# Patient Record
Sex: Male | Born: 1970 | Hispanic: No | Marital: Married | Smoking: Never smoker
Health system: Southern US, Community
[De-identification: ages and names within clinical notes are randomized; demographics above are authoritative.]

## PROBLEM LIST (undated history)

## (undated) DIAGNOSIS — E119 Type 2 diabetes mellitus without complications: Secondary | ICD-10-CM

## (undated) DIAGNOSIS — R000 Tachycardia, unspecified: Secondary | ICD-10-CM

## (undated) DIAGNOSIS — R011 Cardiac murmur, unspecified: Principal | ICD-10-CM

## (undated) DIAGNOSIS — I10 Essential (primary) hypertension: Secondary | ICD-10-CM

## (undated) HISTORY — DX: Cardiac murmur, unspecified: R01.1

## (undated) HISTORY — DX: Tachycardia, unspecified: R00.0

## (undated) HISTORY — DX: Essential (primary) hypertension: I10

## (undated) HISTORY — DX: Type 2 diabetes mellitus without complications: E11.9

---

## 2015-09-01 ENCOUNTER — Encounter (HOSPITAL_COMMUNITY): Payer: Self-pay | Admitting: Emergency Medicine

## 2015-09-01 ENCOUNTER — Emergency Department (HOSPITAL_COMMUNITY)
Admission: EM | Admit: 2015-09-01 | Discharge: 2015-09-01 | Disposition: A | Discharge status: Home / Self Care | Payer: Managed Care, Other (non HMO) | Attending: Emergency Medicine | Admitting: Emergency Medicine

## 2015-09-01 ENCOUNTER — Emergency Department (HOSPITAL_COMMUNITY): Payer: Managed Care, Other (non HMO)

## 2015-09-01 DIAGNOSIS — R200 Anesthesia of skin: Secondary | ICD-10-CM | POA: Diagnosis present

## 2015-09-01 DIAGNOSIS — F419 Anxiety disorder, unspecified: Secondary | ICD-10-CM | POA: Insufficient documentation

## 2015-09-01 DIAGNOSIS — E119 Type 2 diabetes mellitus without complications: Secondary | ICD-10-CM | POA: Diagnosis not present

## 2015-09-01 DIAGNOSIS — E860 Dehydration: Secondary | ICD-10-CM | POA: Diagnosis not present

## 2015-09-01 DIAGNOSIS — R000 Tachycardia, unspecified: Secondary | ICD-10-CM | POA: Diagnosis not present

## 2015-09-01 DIAGNOSIS — Z79899 Other long term (current) drug therapy: Secondary | ICD-10-CM | POA: Diagnosis not present

## 2015-09-01 HISTORY — DX: Type 2 diabetes mellitus without complications: E11.9

## 2015-09-01 LAB — I-STAT TROPONIN, ED: TROPONIN I, POC: 0 ng/mL (ref 0.00–0.08)

## 2015-09-01 LAB — BASIC METABOLIC PANEL
ANION GAP: 10 (ref 5–15)
BUN: 9 mg/dL (ref 6–20)
CHLORIDE: 102 mmol/L (ref 101–111)
CO2: 26 mmol/L (ref 22–32)
Calcium: 9.7 mg/dL (ref 8.9–10.3)
Creatinine, Ser: 0.78 mg/dL (ref 0.61–1.24)
GFR calc non Af Amer: >60 mL/min (ref >60)
GLUCOSE: 198 mg/dL — AB (ref 65–99)
POTASSIUM: 3.8 mmol/L (ref 3.5–5.1)
Sodium: 138 mmol/L (ref 135–145)

## 2015-09-01 LAB — I-STAT VENOUS BLOOD GAS, ED
Acid-base deficit: 1 mmol/L (ref 0.0–2.0)
Bicarbonate: 25.5 mEq/L — ABNORMAL HIGH (ref 20.0–24.0)
O2 SAT: 88 %
PCO2 VEN: 46.7 mmHg (ref 45.0–50.0)
PH VEN: 7.345 — AB (ref 7.250–7.300)
PO2 VEN: 58 mmHg — AB (ref 30.0–45.0)
TCO2: 27 mmol/L (ref 0–100)

## 2015-09-01 LAB — CBG MONITORING, ED
Glucose-Capillary: 191 mg/dL — ABNORMAL HIGH (ref 65–99)
Glucose-Capillary: 120 mg/dL — ABNORMAL HIGH (ref 65–99)

## 2015-09-01 LAB — CBC
HEMATOCRIT: 41.9 % (ref 39.0–52.0)
HEMOGLOBIN: 14.4 g/dL (ref 13.0–17.0)
MCH: 29 pg (ref 26.0–34.0)
MCHC: 34.4 g/dL (ref 30.0–36.0)
MCV: 84.5 fL (ref 78.0–100.0)
Platelets: 313 10*3/uL (ref 150–400)
RBC: 4.96 MIL/uL (ref 4.22–5.81)
RDW: 12.7 % (ref 11.5–15.5)
WBC: 7.7 10*3/uL (ref 4.0–10.5)

## 2015-09-01 LAB — OSMOLALITY: Osmolality: 294 mOsm/kg (ref 275–295)

## 2015-09-01 MED ORDER — SODIUM CHLORIDE 0.9 % IV BOLUS (SEPSIS)
1000.0000 mL | Freq: Once | INTRAVENOUS | Status: AC
  Administered 2015-09-01: 1000 mL via INTRAVENOUS

## 2015-09-01 MED ORDER — LORAZEPAM 1 MG PO TABS
1.0000 mg | ORAL_TABLET | Freq: Once | ORAL | Status: AC
  Administered 2015-09-01: 1 mg via ORAL
  Filled 2015-09-01: qty 1

## 2015-09-01 NOTE — ED Notes (Signed)
Patient transported to X-ray 

## 2015-09-01 NOTE — ED Notes (Signed)
Per pt, was seen his PCP today for numbness in his L pointer finger that started yesterday. Pt states he is diabetic. The numbness has resolved, however ekg done at PCP shows atrial flutter and pt denies hx of this. Pt is AAOX4, in NAD denies CP or SOB.

## 2015-09-01 NOTE — Discharge Instructions (Signed)
Follow-up with your primary care physician on Monday

## 2015-09-01 NOTE — ED Provider Notes (Signed)
CSN: [id number]     Arrival date & time 09/01/15  1706 History   First MD Initiated Contact with Patient 09/01/15 1726     Chief Complaint  Patient presents with  . Numbness  . Atrial Fibrillation     (Consider location/radiation/quality/duration/timing/severity/associated sxs/prior Treatment) HPI  Patient is a 44 year old male with past medical history significant for diabetes, who presents to the emergency department with left index finger numbness and tachycardia. Patient reports that last night he noted a gradual onset of numbness to the distal portion of his left index finger. Denies trauma or pain to his finger. Numbness does not travel up his arm. Numbness improved when he elevates his left hand. His prior history of numbness or injury in the past. Patient was evaluated at his PCP earlier today. EKG at his PCP showed a flutter, which was new for the patient so he was sent to the emergency department for evaluation. Denies heart palpitations, chest pain, shortness of breath, nausea, vomiting, diaphoresis. Reports he has increase urination and decreased by mouth intake over the past week due to being busy at work. Has missed multiple doses of his diabetic medications and has not checked his glucose. Denies prior history of heart arrhythmias.  Past Medical History  Diagnosis Date  . Diabetes mellitus without complication (HCC)    History reviewed. No pertinent past surgical history. No family history on file. Social History  Substance Use Topics  . Smoking status: Never Smoker   . Smokeless tobacco: None  . Alcohol Use: No    Review of Systems  Constitutional: Negative for fever, diaphoresis and appetite change.  HENT: Negative for congestion.   Eyes: Negative for visual disturbance.  Respiratory: Negative for chest tightness and shortness of breath.   Cardiovascular: Negative for chest pain and palpitations.  Gastrointestinal: Negative for nausea, vomiting, abdominal pain and  blood in stool.  Genitourinary: Negative for dysuria, hematuria, flank pain and decreased urine volume.  Musculoskeletal: Negative for back pain.  Skin: Negative for rash.  Neurological: Positive for numbness. Negative for dizziness, seizures, weakness, light-headedness and headaches.  Psychiatric/Behavioral: Negative for behavioral problems. The patient is nervous/anxious.       Allergies  Review of patient's allergies indicates no known allergies.  Home Medications   Prior to Admission medications   Medication Sig Start Date End Date Taking? Authorizing Provider  losartan-hydrochlorothiazide (HYZAAR) 100-12.5 MG tablet Take 1 tablet by mouth daily.   Yes Historical Provider, MD  SitaGLIPtin-MetFORMIN HCl (JANUMET XR) 50-1000 MG TB24 Take 2 tablets by mouth daily.   Yes Historical Provider, MD   BP 99/62 mmHg  Pulse 86  Temp(Src) 97.8 F (36.6 C) (Oral)  Resp 15  SpO2 100% Physical Exam  Constitutional: He is oriented to person, place, and time. He appears well-developed and well-nourished. No distress.  HENT:  Head: Normocephalic and atraumatic.  Mouth/Throat: Oropharynx is clear and moist.  Eyes: Conjunctivae and EOM are normal. Pupils are equal, round, and reactive to light.  Neck: Normal range of motion. Neck supple. No JVD present. No tracheal deviation present.  Cardiovascular: Regular rhythm, normal heart sounds and intact distal pulses.   No murmur heard. Tachycardic   Pulmonary/Chest: Effort normal and breath sounds normal. No respiratory distress. He has no wheezes. He exhibits no tenderness.  Abdominal: Soft. There is no tenderness.  Musculoskeletal: Normal range of motion.       Left hand: He exhibits normal range of motion, no tenderness, no bony tenderness, normal capillary refill, no deformity  and no swelling. Normal sensation noted. Normal strength noted.  L distal index finger with sensation intact  Neurological: He is alert and oriented to person, place,  and time. He has normal strength and normal reflexes. No cranial nerve deficit or sensory deficit. He displays a negative Romberg sign. GCS eye subscore is 4. GCS verbal subscore is 5. GCS motor subscore is 6.  Skin: Skin is warm.  Psychiatric: He has a normal mood and affect.  Nursing note and vitals reviewed.   ED Course  Procedures (including critical care time) Labs Review Labs Reviewed  BASIC METABOLIC PANEL - Abnormal; Notable for the following:    Glucose, Bld 198 (*)    All other components within normal limits  CBG MONITORING, ED - Abnormal; Notable for the following:    Glucose-Capillary 191 (*)    All other components within normal limits  I-STAT VENOUS BLOOD GAS, ED - Abnormal; Notable for the following:    pH, Ven 7.345 (*)    pO2, Ven 58.0 (*)    Bicarbonate 25.5 (*)    All other components within normal limits  CBG MONITORING, ED - Abnormal; Notable for the following:    Glucose-Capillary 120 (*)    All other components within normal limits  CBC  OSMOLALITY  I-STAT TROPOININ, ED    Imaging Review Dg Chest 2 View  09/01/2015  CLINICAL DATA:  Tachycardia. Abnormal EKG. Diabetes and hypertension. EXAM: CHEST  2 VIEW COMPARISON:  None. FINDINGS: The heart size and mediastinal contours are within normal limits. Both lungs are clear. The visualized skeletal structures are unremarkable. IMPRESSION: No active cardiopulmonary disease. Electronically Signed   By: Myles RosenthalJohn  Stahl M.D.   On: 09/01/2015 17:56   I have personally reviewed and evaluated these images and lab results as part of my medical decision-making.   EKG Interpretation   Date/Time:  Friday September 01 2015 17:20:54 EST Ventricular Rate:  117 PR Interval:  138 QRS Duration: 82 QT Interval:  310 QTC Calculation: 432 R Axis:   80 Text Interpretation:  Sinus tachycardia Otherwise normal ECG Confirmed by  LITTLE MD, RACHEL [phone]) on 09/01/2015 7:40:53 PM      MDM   Final diagnoses:  Numbness   Tachycardia  Dehydration    Patient is a 44 year old male with past medical history significant for diabetes, who presents to the emergency department with tachycardia and a left index finger numbness. On arrival no acute distress, not ill appearing. Afebrile, tachycardic in the 120s, normotensive. Exam as above, notable for dry mucous membranes, lungs clear to auscultation, RRR, intact distal pulses, benign abdominal exam.  Glucose 191. Labwork unremarkable without any signs of DKA or hyperosmolar hyperglycemic syndrome. Hyperglycemia secondary to medication non-compliance, doubt elevation secondary to infectious process. No electrolyte abnormalities. EKG showed sinus tachycardia, rate 117, normal intervals, no chamber enlargement, no signs of ischemia. No prior EKG to compare. Chest x-ray showed no acute findings. Patient most likely with tachycardia secondary to volume depletion in the setting of decreased PO intake and polydipsia. No chest pain, palpitations, or shortness of breath.  Low risk Well's criteria. HEART score 2. Doubt pulmonary embolism, ACS, infectious process.  Serial troponin negative. Given 1L normal saline 2.   On reevaluation patient's tachycardia improved, but continued to be tachycardic in the low 100s patient reports he has had a significant amount of stress and anxiety, is currently anxious about work. States she has not had a day off of work in the past month, and states that  he worries about work constantly. Patient appears anxious. Given 1 mg Ativan by mouth. On reevaluation patient's tachycardia resolved, rate in the 80s. Patient asymptomatic with stable vital signs.   Patient stable for discharge home. Discussed follow up with PCP on Monday for re-evaluation. Given strict return precautions to the emergency department.     Corena Herter, MD 09/02/15 1914  Laurence Spates, MD 09/06/15 [phone]

## 2015-09-05 ENCOUNTER — Telehealth: Payer: Self-pay | Admitting: Cardiovascular Disease

## 2015-09-05 NOTE — Telephone Encounter (Signed)
Received records from [facility] for appointment on 09/28/15 with Dr Rifton.  Records given to N Hines (medical records) for Dr Hazel Green's schedule on 09/28/15. lp °

## 2015-09-06 ENCOUNTER — Emergency Department (HOSPITAL_COMMUNITY): Payer: Managed Care, Other (non HMO)

## 2015-09-06 ENCOUNTER — Emergency Department (HOSPITAL_COMMUNITY)
Admission: EM | Admit: 2015-09-06 | Discharge: 2015-09-06 | Disposition: A | Discharge status: Home / Self Care | Payer: Managed Care, Other (non HMO) | Attending: Emergency Medicine | Admitting: Emergency Medicine

## 2015-09-06 ENCOUNTER — Encounter (HOSPITAL_COMMUNITY): Payer: Self-pay | Admitting: Emergency Medicine

## 2015-09-06 DIAGNOSIS — R5383 Other fatigue: Secondary | ICD-10-CM | POA: Insufficient documentation

## 2015-09-06 DIAGNOSIS — R000 Tachycardia, unspecified: Secondary | ICD-10-CM | POA: Insufficient documentation

## 2015-09-06 DIAGNOSIS — Z7984 Long term (current) use of oral hypoglycemic drugs: Secondary | ICD-10-CM | POA: Diagnosis not present

## 2015-09-06 DIAGNOSIS — R531 Weakness: Secondary | ICD-10-CM | POA: Diagnosis not present

## 2015-09-06 DIAGNOSIS — R630 Anorexia: Secondary | ICD-10-CM | POA: Insufficient documentation

## 2015-09-06 DIAGNOSIS — F419 Anxiety disorder, unspecified: Secondary | ICD-10-CM | POA: Insufficient documentation

## 2015-09-06 DIAGNOSIS — E1165 Type 2 diabetes mellitus with hyperglycemia: Secondary | ICD-10-CM | POA: Diagnosis not present

## 2015-09-06 DIAGNOSIS — R739 Hyperglycemia, unspecified: Secondary | ICD-10-CM

## 2015-09-06 DIAGNOSIS — R202 Paresthesia of skin: Secondary | ICD-10-CM

## 2015-09-06 DIAGNOSIS — Z79899 Other long term (current) drug therapy: Secondary | ICD-10-CM | POA: Insufficient documentation

## 2015-09-06 DIAGNOSIS — R200 Anesthesia of skin: Secondary | ICD-10-CM | POA: Diagnosis present

## 2015-09-06 DIAGNOSIS — R079 Chest pain, unspecified: Secondary | ICD-10-CM | POA: Diagnosis not present

## 2015-09-06 DIAGNOSIS — Z8679 Personal history of other diseases of the circulatory system: Secondary | ICD-10-CM | POA: Insufficient documentation

## 2015-09-06 LAB — CBC
HEMATOCRIT: 40.3 % (ref 39.0–52.0)
HEMOGLOBIN: 13.7 g/dL (ref 13.0–17.0)
MCH: 28.7 pg (ref 26.0–34.0)
MCHC: 34 g/dL (ref 30.0–36.0)
MCV: 84.5 fL (ref 78.0–100.0)
Platelets: 260 10*3/uL (ref 150–400)
RBC: 4.77 MIL/uL (ref 4.22–5.81)
RDW: 12.4 % (ref 11.5–15.5)
WBC: 7 10*3/uL (ref 4.0–10.5)

## 2015-09-06 LAB — BASIC METABOLIC PANEL
ANION GAP: 11 (ref 5–15)
BUN: 12 mg/dL (ref 6–20)
CO2: 23 mmol/L (ref 22–32)
Calcium: 9.2 mg/dL (ref 8.9–10.3)
Chloride: 101 mmol/L (ref 101–111)
Creatinine, Ser: 0.9 mg/dL (ref 0.61–1.24)
GFR calc Af Amer: >60 mL/min (ref >60)
GLUCOSE: 252 mg/dL — AB (ref 65–99)
POTASSIUM: 3.8 mmol/L (ref 3.5–5.1)
SODIUM: 135 mmol/L (ref 135–145)

## 2015-09-06 LAB — I-STAT TROPONIN, ED: Troponin i, poc: 0 ng/mL (ref 0.00–0.08)

## 2015-09-06 MED ORDER — SODIUM CHLORIDE 0.9 % IV BOLUS (SEPSIS)
1000.0000 mL | Freq: Once | INTRAVENOUS | Status: AC
  Administered 2015-09-06: 1000 mL via INTRAVENOUS

## 2015-09-06 NOTE — ED Notes (Signed)
Pt comfortable with discharge and follow up instructions. Pt declines wheelchair, escorted to waiting area by this RN. No prescriptions. 

## 2015-09-06 NOTE — ED Provider Notes (Signed)
CSN: [id number]     Arrival date & time 09/06/15  0900 History   First MD Initiated Contact with Patient 09/06/15 [phone]     Chief Complaint  Patient presents with  . Numbness      The history is provided by the patient.   patient presents with a couple different complaints. States he's had some numbness in his left hand. Starts on his left index finger on the PIP joint and works its way out. States it feels as if someone put a rubber band around it. He states a few days ago he had some cold exposure and all the fingers were numb but this one just a numb. He's been seen in the ER for it. He got seen by primary care doctor diagnosed with atrial flutter, which is reportedly new onset. States he now has occasional chest pain. States it will be a sharp tingle and will last a second. It'll come and go. States he also has some numbness and heaviness in his left forearm. States it is from the elbow down. States that things feel heavier than they are otherwise. That will also come and go. No headache. No confusion. No weight loss. Also has some anxiety. He's been eating and drinking less due to work.  Past Medical History  Diagnosis Date  . Diabetes mellitus without complication (HCC)    History reviewed. No pertinent past surgical history. No family history on file. Social History  Substance Use Topics  . Smoking status: Never Smoker   . Smokeless tobacco: None  . Alcohol Use: No    Review of Systems  Constitutional: Positive for fatigue. Negative for activity change and appetite change.  Eyes: Negative for pain.  Respiratory: Negative for chest tightness and shortness of breath.   Cardiovascular: Positive for chest pain. Negative for leg swelling.  Gastrointestinal: Negative for nausea, vomiting, abdominal pain and diarrhea.  Genitourinary: Negative for flank pain.  Musculoskeletal: Negative for back pain and neck stiffness.  Skin: Negative for rash.  Neurological: Positive for weakness  and numbness. Negative for headaches.  Psychiatric/Behavioral: Negative for behavioral problems. The patient is nervous/anxious.       Allergies  Review of patient's allergies indicates no known allergies.  Home Medications   Prior to Admission medications   Medication Sig Start Date End Date Taking? Authorizing Provider  losartan-hydrochlorothiazide (HYZAAR) 100-12.5 MG tablet Take 1 tablet by mouth daily.   Yes Historical Provider, MD  SitaGLIPtin-MetFORMIN HCl (JANUMET XR) 50-1000 MG TB24 Take 1 tablet by mouth 2 (two) times daily.    Yes Historical Provider, MD   BP 111/87 mmHg  Pulse 110  Temp(Src) 98.1 F (36.7 C) (Oral)  Resp 16  Ht  (1.676 m)  Wt 155 lb (70.308 kg)  BMI 25.03 kg/m2  SpO2 99% Physical Exam  Constitutional: He is oriented to person, place, and time. He appears well-developed and well-nourished.  HENT:  Head: Normocephalic and atraumatic.  Eyes: EOM are normal. Pupils are equal, round, and reactive to light.  Neck: Normal range of motion. Neck supple.  Cardiovascular: Regular rhythm and normal heart sounds.   No murmur heard. Mild tachycardia  Pulmonary/Chest: Effort normal and breath sounds normal.  Abdominal: Soft. Bowel sounds are normal. He exhibits no distension and no mass. There is no tenderness. There is no rebound and no guarding.  Musculoskeletal: Normal range of motion. He exhibits no edema.  Neurological: He is alert and oriented to person, place, and time. No cranial nerve deficit.  Sensation grossly intact in both hands. Strong radial pulse bilaterally. Good grips bilaterally. Good flexion-extension elbows and shoulders.  Skin: Skin is warm and dry.  Psychiatric: He has a normal mood and affect.  Nursing note and vitals reviewed.   ED Course  Procedures (including critical care time) Labs Review Labs Reviewed  BASIC METABOLIC PANEL - Abnormal; Notable for the following:    Glucose, Bld 252 (*)    All other components within  normal limits  CBC  I-STAT TROPOININ, ED    Imaging Review Mr Brain Wo Contrast  09/06/2015  CLINICAL DATA:  Five day history of numbness in the left hand. New onset of weakness in the left shoulder and left upper extremity in general. Intermittent numbness in the right hand. EXAM: MRI HEAD WITHOUT CONTRAST TECHNIQUE: Multiplanar, multiecho pulse sequences of the brain and surrounding structures were obtained without intravenous contrast. COMPARISON:  None. FINDINGS: The brain has a normal appearance on all pulse sequences without evidence of malformation, atrophy, old or acute infarction, mass lesion, hemorrhage, hydrocephalus or extra-axial collection. No pituitary mass. No fluid in the sinuses, middle ears or mastoids. No skull or skullbase lesion. There is flow in the major vessels at the base of the brain. Major venous sinuses show flow. IMPRESSION: Normal examination. No cause of the presenting symptoms is identified. Electronically Signed   By: Paulina FusiMark  Shogry M.D.   On: 09/06/2015 12:02   I have personally reviewed and evaluated these images and lab results as part of my medical decision-making.   EKG Interpretation   Date/Time:  Wednesday September 06 2015 09:25:18 EST Ventricular Rate:  131 PR Interval:  139 QRS Duration: 85 QT Interval:  302 QTC Calculation: 446 R Axis:   69 Text Interpretation:  Sinus tachycardia Borderline T wave abnormalities  Confirmed by Rubin PayorPICKERING  MD, Lyrick Lagrand [phone]([id number]) on 09/06/2015 10:21:00 AM      MDM   Final diagnoses:  Tachycardia  Paresthesias  Hyperglycemia     patient tachycardia. Sinus tachycardia. EKG at the patient brought from the doctor's office previously that was interpreted as atrial flutter does not appear to show atrial flutter. I do not know if this was what they made the determination off of or if there was another EKG showed it. Without the history of atrial flutter is a us risk for ischemic event. MRI done since the arm seemed heavier  but this is likely more carpal tunnel. Also mild hyperglycemia patient is diabetic. Will discharge home to follow-up with cardiology as needed and with PCP.    Benjiman CoreNathan Curtez Brallier, MD 09/07/15 [phone]

## 2015-09-06 NOTE — Discharge Instructions (Signed)
Hyperglycemia °Hyperglycemia occurs when the glucose (sugar) in your blood is too high. Hyperglycemia can happen for many reasons, but it most often happens to people who do not know they have diabetes or are not managing their diabetes properly.  °CAUSES  °Whether you have diabetes or not, there are other causes of hyperglycemia. Hyperglycemia can occur when you have diabetes, but it can also occur in other situations that you might not be as aware of, such as: °Diabetes °· If you have diabetes and are having problems controlling your blood glucose, hyperglycemia could occur because of some of the following reasons: °· Not following your meal plan. °· Not taking your diabetes medications or not taking it properly. °· Exercising less or doing less activity than you normally do. °· Being sick. °Pre-diabetes °· This cannot be ignored. Before people develop Type 2 diabetes, they almost always have "pre-diabetes." This is when your blood glucose levels are higher than normal, but not yet high enough to be diagnosed as diabetes. Research has shown that some long-term damage to the body, especially the heart and circulatory system, may already be occurring during pre-diabetes. If you take action to manage your blood glucose when you have pre-diabetes, you may delay or prevent Type 2 diabetes from developing. °Stress °· If you have diabetes, you may be "diet" controlled or on oral medications or insulin to control your diabetes. However, you may find that your blood glucose is higher than usual in the hospital whether you have diabetes or not. This is often referred to as "stress hyperglycemia." Stress can elevate your blood glucose. This happens because of hormones put out by the body during times of stress. If stress has been the cause of your high blood glucose, it can be followed regularly by your caregiver. That way he/she can make sure your hyperglycemia does not continue to get worse or progress to  diabetes. °Steroids °· Steroids are medications that act on the infection fighting system (immune system) to block inflammation or infection. One side effect can be a rise in blood glucose. Most people can produce enough extra insulin to allow for this rise, but for those who cannot, steroids make blood glucose levels go even higher. It is not unusual for steroid treatments to "uncover" diabetes that is developing. It is not always possible to determine if the hyperglycemia will go away after the steroids are stopped. A special blood test called an A1c is sometimes done to determine if your blood glucose was elevated before the steroids were started. °SYMPTOMS °· Thirsty. °· Frequent urination. °· Dry mouth. °· Blurred vision. °· Tired or fatigue. °· Weakness. °· Sleepy. °· Tingling in feet or leg. °DIAGNOSIS  °Diagnosis is made by monitoring blood glucose in one or all of the following ways: °· A1c test. This is a chemical found in your blood. °· Fingerstick blood glucose monitoring. °· Laboratory results. °TREATMENT  °First, knowing the cause of the hyperglycemia is important before the hyperglycemia can be treated. Treatment may include, but is not be limited to: °· Education. °· Change or adjustment in medications. °· Change or adjustment in meal plan. °· Treatment for an illness, infection, etc. °· More frequent blood glucose monitoring. °· Change in exercise plan. °· Decreasing or stopping steroids. °· Lifestyle changes. °HOME CARE INSTRUCTIONS  °· Test your blood glucose as directed. °· Exercise regularly. Your caregiver will give you instructions about exercise. Pre-diabetes or diabetes which comes on with stress is helped by exercising. °· Eat wholesome,   balanced meals. Eat often and at regular, fixed times. Your caregiver or nutritionist will give you a meal plan to guide your sugar intake.  Being at an ideal weight is important. If needed, losing as little as 10 to 15 pounds may help improve blood  glucose levels. SEEK MEDICAL CARE IF:   You have questions about medicine, activity, or diet.  You continue to have symptoms (problems such as increased thirst, urination, or weight gain). SEEK IMMEDIATE MEDICAL CARE IF:   You are vomiting or have diarrhea.  Your breath smells fruity.  You are breathing faster or slower.  You are very sleepy or incoherent.  You have numbness, tingling, or pain in your feet or hands.  You have chest pain.  Your symptoms get worse even though you have been following your caregiver's orders.  If you have any other questions or concerns.   This information is not intended to replace advice given to you by your health care provider. Make sure you discuss any questions you have with your health care provider.   Document Released: 03/05/2001 Document Revised: 12/02/2011 Document Reviewed: 05/16/2015 Elsevier Interactive Patient Education 2016 [employer] Inc[employer]  Nonspecific Tachycardia Tachycardia is a faster than normal heartbeat (more than 100 beats per minute). In adults, the heart normally beats between 60 and 100 times a minute. A fast heartbeat may be a normal response to exercise or stress. It does not necessarily mean that something is wrong. However, sometimes when your heart beats too fast it may not be able to pump enough blood to the rest of your body. This can result in chest pain, shortness of breath, dizziness, and even fainting. Nonspecific tachycardia means that the specific cause or pattern of your tachycardia is unknown. CAUSES  Tachycardia may be harmless or it may be due to a more serious underlying cause. Possible causes of tachycardia include:  Exercise or exertion.  Fever.  Pain or injury.  Infection.  Loss of body fluids (dehydration).  Overactive thyroid.  Lack of red blood cells (anemia).  Anxiety and stress.  Alcohol.  Caffeine.  Tobacco products.  Diet pills.  Illegal drugs.  Heart  disease. SYMPTOMS  Rapid or irregular heartbeat (palpitations).  Suddenly feeling your heart beating (cardiac awareness).  Dizziness.  Tiredness (fatigue).  Shortness of breath.  Chest pain.  Nausea.  Fainting. DIAGNOSIS  Your caregiver will perform a physical exam and take your medical history. In some cases, a heart specialist (cardiologist) may be consulted. Your caregiver may also order:  Blood tests.  Electrocardiography. This test records the electrical activity of your heart.  A heart monitoring test. TREATMENT  Treatment will depend on the likely cause of your tachycardia. The goal is to treat the underlying cause of your tachycardia. Treatment methods may include:  Replacement of fluids or blood through an intravenous (IV) tube for moderate to severe dehydration or anemia.  New medicines or changes in your current medicines.  Diet and lifestyle changes.  Treatment for certain infections.  Stress relief or relaxation methods. HOME CARE INSTRUCTIONS   Rest.  Drink enough fluids to keep your urine clear or pale yellow.  Do not smoke.  Avoid:  Caffeine.  Tobacco.  Alcohol.  Chocolate.  Stimulants such as over-the-counter diet pills or pills that help you stay awake.  Situations that cause anxiety or stress.  Illegal drugs such as marijuana, phencyclidine (PCP), and cocaine.  Only take medicine as directed by your caregiver.  Keep all follow-up appointments as directed by your  caregiver. SEEK IMMEDIATE MEDICAL CARE IF:   You have pain in your chest, upper arms, jaw, or neck.  You become weak, dizzy, or feel faint.  You have palpitations that will not go away.  You vomit, have diarrhea, or pass blood in your stool.  Your skin is cool, pale, and wet.  You have a fever that will not go away with rest, fluids, and medicine. MAKE SURE YOU:   Understand these instructions.  Will watch your condition.  Will get help right away if you  are not doing well or get worse.   This information is not intended to replace advice given to you by your health care provider. Make sure you discuss any questions you have with your health care provider.   Document Released: 10/17/2004 Document Revised: 12/02/2011 Document Reviewed: 03/24/2015 Elsevier Interactive Patient Education 2016 Else[employer] Inc. Carpal Tunnel Syndrome Carpal tunnel syndrome is a condition that causes pain in your hand and arm. The carpal tunnel is a narrow area located on the palm side of your wrist. Repeated wrist motion or certain diseases may cause swelling within the tunnel. This swelling pinches the main nerve in the wrist (median nerve). CAUSES  This condition may be caused by:   Repeated wrist motions.  Wrist injuries.  Arthritis.  A cyst or tumor in the carpal tunnel.  Fluid buildup during pregnancy. Sometimes the cause of this condition is not known.  RISK FACTORS This condition is more likely to develop in:   People who have jobs that cause them to repeatedly move their wrists in the same motion, such as butchers and cashiers.  Women.  People with certain conditions, such as:  Diabetes.  Obesity.  An underactive thyroid (hypothyroidism).  Kidney failure. SYMPTOMS  Symptoms of this condition include:   A tingling feeling in your fingers, especially in your thumb, index, and middle fingers.  Tingling or numbness in your hand.  An aching feeling in your entire arm, especially when your wrist and elbow are bent for long periods of time.  Wrist pain that goes up your arm to your shoulder.  Pain that goes down into your palm or fingers.  A weak feeling in your hands. You may have trouble grabbing and holding items. Your symptoms may feel worse during the night.  DIAGNOSIS  This condition is diagnosed with a medical history and physical exam. You may also have tests, including:   An electromyogram (EMG). This test measures electrical  signals sent by your nerves into the muscles.  X-rays. TREATMENT  Treatment for this condition includes:  Lifestyle changes. It is important to stop doing or modify the activity that caused your condition.  Physical or occupational therapy.  Medicines for pain and inflammation. This may include medicine that is injected into your wrist.  A wrist splint.  Surgery. HOME CARE INSTRUCTIONS  If You Have a Splint:  Wear it as told by your health care provider. Remove it only as told by your health care provider.  Loosen the splint if your fingers become numb and tingle, or if they turn cold and blue.  Keep the splint clean and dry. General Instructions  Take over-the-counter and prescription medicines only as told by your health care provider.  Rest your wrist from any activity that may be causing your pain. If your condition is work related, talk to your employer about changes that can be made, such as getting a wrist pad to use while typing.  If directed, apply ice to  the painful area:  Put ice in a plastic bag.  Place a towel between your skin and the bag.  Leave the ice on for 20 minutes, 2-3 times per day.  Keep all follow-up visits as told by your health care provider. This is important.  Do any exercises as told by your health care provider, physical therapist, or occupational therapist. SEEK MEDICAL CARE IF:   You have new symptoms.  Your pain is not controlled with medicines.  Your symptoms get worse.   This information is not intended to replace advice given to you by your health care provider. Make sure you discuss any questions you have with your health care provider.   Document Released: 09/06/2000 Document Revised: 05/31/2015 Document Reviewed: 01/25/2015 Elsevier Interactive Patient Education [employer]

## 2015-09-06 NOTE — ED Notes (Signed)
Seen in ED 5 days ago for numbness left fingers and tachycardia.  Discharged and seen Primary Doctor yesterday.  Patient states last night chest pain for couple of seconds with pain and heaviness left shoulder and left upper extremity. At a different time developed numbness right hand and resolved with in seconds. States continue to have numbness since last week left index finger. Currently wearing a velco splint left wrist given by Doctor one day ago.

## 2015-09-27 ENCOUNTER — Other Ambulatory Visit: Payer: Self-pay | Admitting: *Deleted

## 2015-09-27 ENCOUNTER — Encounter: Payer: Self-pay | Admitting: Endocrinology

## 2015-09-27 ENCOUNTER — Ambulatory Visit (INDEPENDENT_AMBULATORY_CARE_PROVIDER_SITE_OTHER): Payer: Managed Care, Other (non HMO) | Admitting: Endocrinology

## 2015-09-27 VITALS — BP 114/70 | HR 133 | Temp 98.1°F | Resp 14 | Ht 66.0 in | Wt 154.8 lb

## 2015-09-27 DIAGNOSIS — I10 Essential (primary) hypertension: Secondary | ICD-10-CM | POA: Diagnosis not present

## 2015-09-27 DIAGNOSIS — R000 Tachycardia, unspecified: Secondary | ICD-10-CM

## 2015-09-27 DIAGNOSIS — E1165 Type 2 diabetes mellitus with hyperglycemia: Secondary | ICD-10-CM | POA: Diagnosis not present

## 2015-09-27 LAB — TSH: TSH: 1.34 u[IU]/mL (ref 0.35–4.50)

## 2015-09-27 LAB — T4, FREE: FREE T4: 1.19 ng/dL (ref 0.60–1.60)

## 2015-09-27 MED ORDER — GLUCOSE BLOOD VI STRP: ORAL_STRIP | Status: DC

## 2015-09-27 NOTE — Patient Instructions (Signed)
Check blood sugars on waking up  2-3 times a week Also check blood sugars about 2 hours after a meal and do this after different meals by rotation once a day  Recommended blood sugar levels on waking up is 90-130 and about 2 hours after meal is 130-160  Please bring your blood sugar monitor to each visit, thank you  Start adding protein to each meal in the form of lentils, beans, tofu, yogurt.  Reduce portions of starchy foods like rice, potatoes and  bread, use brown rice and whole-grain bread  Start walking 20-40 minutes at least 3-4 times a week

## 2015-09-27 NOTE — Progress Notes (Signed)
Patient ID: Roberto Roberson, male   DOB: [birth year], 45 y.o.   MRN: [id number]           Reason for Appointment: Consultation for Type 2 Diabetes  Referring physician: None  History of Present Illness:          Date of diagnosis of type 2 diabetes mellitus:  2006      Background history:   At diagnosis he had a glucose of 300 and do symptomatic with increased thirst and urination He was given unknown oral hypoglycemic drugs in [place] before he came here  Recent history:   He has recently moved from [place] and he has been treated with Janumet XR since late 2015, previously on glipizide and metformin His A1c was 6.4 in 7/16  Current blood sugar patterns and problems identified:    his A1c is slightly higher than 6.4 in July  He will sometimes have higher readings in the morning but he thinks this is more if eating late is checking his blood sugar for couple of hours after waking up  Periodically has readings over 180 after meals but recently has not checked many  His diet is relatively higher in carbohydrate with very little protein  Does not exercise  Checks blood sugar sporadically at least recently  Non-insulin hypoglycemic drugs the patient is taking are: Janumet 50/1000 twice a day      Side effects from medications have been:  Compliance with the medical regimen: Fair  Hypoglycemia:    Glucose monitoring:  done  times a day         Glucometer:  Accu-Chek Blood Glucose readings by time of day and averages from meter download, has only 7 readings for the last 4 weeks:  PREMEAL Breakfast Lunch Dinner Bedtime  Overall   Glucose range: 115-166  109   121     Median:      143    POST-MEAL PC Breakfast PC Lunch PC Dinner  Glucose range:   199    Median:      Self-care: The diet that the patient has been following is: tries to limit sugar and fats .     Meal times are:  Breakfast is at Lunch: Dinner:   Typical meal intake: Breakfast is rice or wheat based  bread, some lentils, similar meals at lunch and other with some vegetables.  Has fruit for snacks                Dietician visit, most recent: Never               Exercise:  none  Weight: has gained about 3-4 pounds since 7/16  Wt Readings from Last 3 Encounters:  09/27/15 154 lb 12.8 oz (70.217 kg)  09/06/15 155 lb (70.308 kg)    Glycemic control:    A1c outside lab = 7.0  Microalbumin/creatinine ratio 1.1 in 7/16 Last LDL was 100   No results found for: HGBA1C Lab Results  Component Value Date   CREATININE 0.90 09/06/2015        Medication List       This list is accurate as of: 09/27/15  9:20 AM.  Always use your most recent med list.               JANUMET XR 50-1000 MG Tb24  Generic drug:  SitaGLIPtin-MetFORMIN HCl  Take 1 tablet by mouth 2 (two) times daily.     losartan-hydrochlorothiazide 100-12.5 MG tablet  Commonly known as:  HYZAAR  Take 1 tablet by mouth daily.        Allergies: No Known Allergies  Past Medical History  Diagnosis Date  . Diabetes mellitus without complication ([place])     No past surgical history on file.  Family History  Problem Relation Age of Onset  . Diabetes Mother   . Diabetes Father   . Hypertension Father   . Heart disease Neg Hx   . Kidney disease Neg Hx     Social History:  reports that he has never smoked. He does not have any smokeless tobacco history on file. He reports that he does not drink alcohol. His drug history is not on file.    Review of Systems    Lipid history: At one point was taking Lipitor but discussed was stopped.  Last LDL 120 16 with HDL 44 and normal triglycerides   No results found for: CHOL, HDL, LDLCALC, LDLDIRECT, TRIG, CHOLHDL         Hypertension: On treatment for about a year  Most recent eye exam was in 12/16  Most recent foot exam: 1/17  Review of Systems  Constitutional: Negative for weight loss.  HENT: Negative for headaches.   Respiratory: Negative for shortness of  breath.   Cardiovascular: Negative for chest pain and palpitations.  Gastrointestinal: Negative for constipation and diarrhea.       Heartburn  Endocrine: Negative for fatigue, polydipsia and light-headedness.       No episodes of flushing  Musculoskeletal: Negative for joint pain.  Skin: Negative for rash.  Neurological: Negative for tingling and tremors.       In the left second finger he feels a stiffness without obvious numbness or tingling for the last month or so  Psychiatric/Behavioral: Negative for nervousness.      LABS:  No visits with results within 1 Week(s) from this visit. Latest known visit with results is:  Admission on 09/06/2015, Discharged on 09/06/2015  Component Date Value Ref Range Status  . Sodium 09/06/2015 135  135 - 145 mmol/L Final  . Potassium 09/06/2015 3.8  3.5 - 5.1 mmol/L Final  . Chloride 09/06/2015 101  101 - 111 mmol/L Final  . CO2 09/06/2015 23  22 - 32 mmol/L Final  . Glucose, Bld 09/06/2015 252* 65 - 99 mg/dL Final  . BUN 09/06/2015 12  6 - 20 mg/dL Final  . Creatinine, Ser 09/06/2015 0.90  0.61 - 1.24 mg/dL Final  . Calcium 09/06/2015 9.2  8.9 - 10.3 mg/dL Final  . GFR calc non Af Amer 09/06/2015 >60  >60 mL/min Final  . GFR calc Af Amer 09/06/2015 >60  >60 mL/min Final   Comment: (NOTE) The eGFR has been calculated using the CKD EPI equation. This calculation has not been validated in all clinical situations. eGFR's persistently <60 mL/min signify possible Chronic Kidney Disease.   . Anion gap 09/06/2015 11  5 - 15 Final  . WBC 09/06/2015 7.0  4.0 - 10.5 K/uL Final  . RBC 09/06/2015 4.77  4.22 - 5.81 MIL/uL Final  . Hemoglobin 09/06/2015 13.7  13.0 - 17.0 g/dL Final  . HCT 09/06/2015 40.3  39.0 - 52.0 % Final  . MCV 09/06/2015 84.5  78.0 - 100.0 fL Final  . MCH 09/06/2015 28.7  26.0 - 34.0 pg Final  . MCHC 09/06/2015 34.0  30.0 - 36.0 g/dL Final  . RDW 09/06/2015 12.4  11.5 - 15.5 % Final  . Platelets 09/06/2015 260  150 - 400  K/uL  Final  . Troponin i, poc 09/06/2015 0.00  0.00 - 0.08 ng/mL Final  . Comment 3 09/06/2015          Final   Comment: Due to the release kinetics of cTnI, a negative result within the first hours of the onset of symptoms does not rule out myocardial infarction with certainty. If myocardial infarction is still suspected, repeat the test at appropriate intervals.     Physical Examination:  BP 114/70 mmHg  Pulse 133  Temp(Src) 98.1 F (36.7 C)  Resp 14  Ht _0  (1.676 m)  Wt 154 lb 12.8 oz (70.217 kg)  BMI 25.00 kg/m2  SpO2 98%  GENERAL:      averagely built and nourished      HEENT:         Eye exam shows normal external appearance. Fundus exam shows no retinopathy.  Oral exam shows normal mucosa.  NECK:   There is no lymphadenopathy Thyroid is not enlarged and no nodules felt.  Carotids are normal to palpation and no bruit heard LUNGS:         Chest is symmetrical. Lungs are clear to auscultation.Marland Kitchen   HEART:       heart rate 112 regular    Heart sounds:  S1 and S2 are normal. No murmur or click heard., no S3 or S4.   ABDOMEN:   There is no distention present. Liver and spleen are not palpable. No other mass or tenderness present.   NEUROLOGICAL:    No tremor Vibration sense is minimally reduced in distal first toes. Ankle and biceps jerks are brisk bilaterally, relatively difficult to elicit .          Diabetic foot exam shows normal monofilament sensation in the toes and plantar surfaces, no skin lesions or ulcers on the feet and normal pedal pulses MUSCULOSKELETAL:  There is no swelling or deformity of the peripheral joints. Spine is normal to inspection.   EXTREMITIES:     There is no edema. No skin lesions present.Marland Kitchen SKIN:       No rash or lesions of concern.        ASSESSMENT:  Diabetes type 2, nonobese with fair control and A1c 7% Previously A1c had been down to 6.4% with current regimen of Janumet XR maximum doses Appears to have mostly high postprandial blood  sugars although checking infrequently recently He can make significant improvements in his diet with reducing carbohydrate and adding protein to each meal    Currently has no exercise regimen Has minimal knowledge of diabetes  Complications: None evident  History of hypertension, well-controlled  Sinus tachycardia which appears to be a recent abnormality.  Etiology unclear and he does not clinically appear hyperthyroid but has not had any recent thyroid levels  PLAN:    Discussed improved meal planning with low glycemic index foods and adding protein to each meal, discussed sources of protein for vegetarian meal plan  Start regular walking, needs to walk about 10 miles a week  More blood sugars after meals including supper which he is not doing  Review blood sugars again in 6 weeks  Consider adding Precose if needed to each meal for better postprandial control  Will check lipids on the next visit  Check thyroid levels today  Patient Instructions  Check blood sugars on waking up  2-3 times a week Also check blood sugars about 2 hours after a meal and do this after different meals by rotation once a day  Recommended blood sugar levels on waking up is 90-130 and about 2 hours after meal is 130-160  Please bring your blood sugar monitor to each visit, thank you  Start adding protein to each meal in the form of lentils, beans, tofu, yogurt.  Reduce portions of starchy foods like rice, potatoes and  bread, use brown rice and whole-grain bread  Start walking 20-40 minutes at least 3-4 times a week     Roberto Roberson 09/27/2015, 9:20 AM   Note: This office note was prepared with Estate agent. Any transcriptional errors that result from this process are unintentional.

## 2015-09-28 ENCOUNTER — Ambulatory Visit (INDEPENDENT_AMBULATORY_CARE_PROVIDER_SITE_OTHER): Payer: Managed Care, Other (non HMO) | Admitting: Cardiovascular Disease

## 2015-09-28 ENCOUNTER — Encounter: Payer: Self-pay | Admitting: Cardiovascular Disease

## 2015-09-28 ENCOUNTER — Other Ambulatory Visit: Payer: Self-pay | Admitting: *Deleted

## 2015-09-28 ENCOUNTER — Telehealth: Payer: Self-pay | Admitting: Endocrinology

## 2015-09-28 VITALS — BP 142/88 | HR 131 | Ht 66.0 in | Wt 155.5 lb

## 2015-09-28 DIAGNOSIS — R011 Cardiac murmur, unspecified: Secondary | ICD-10-CM | POA: Diagnosis not present

## 2015-09-28 DIAGNOSIS — R000 Tachycardia, unspecified: Secondary | ICD-10-CM

## 2015-09-28 DIAGNOSIS — E119 Type 2 diabetes mellitus without complications: Secondary | ICD-10-CM | POA: Insufficient documentation

## 2015-09-28 HISTORY — DX: Cardiac murmur, unspecified: R01.1

## 2015-09-28 HISTORY — DX: Tachycardia, unspecified: R00.0

## 2015-09-28 HISTORY — DX: Type 2 diabetes mellitus without complications: E11.9

## 2015-09-28 MED ORDER — ONETOUCH VERIO IQ SYSTEM W/DEVICE KIT: PACK | Status: DC

## 2015-09-28 MED ORDER — ONETOUCH DELICA LANCETS 33G MISC: Status: DC

## 2015-09-28 MED ORDER — GLUCOSE BLOOD VI STRP: ORAL_STRIP | Status: DC

## 2015-09-28 NOTE — Progress Notes (Signed)
Cardiology Office Note   Date:  09/28/2015   ID:  Roberto Roberson, DOB 09-30-1970, MRN [id number]  PCP:  Emeterio Reeve, MD  Cardiologist:   Madilyn Hook, MD   Chief Complaint  Patient presents with  . Appointment    Denies SOB, CP and swelling      History of Present Illness: Roberto Roberson is a 45 y.o. male with hypertension and diabetes mellitus type 2 who presents for an evaluation of sinus tachycardia.  Roberto Roberson saw Dr. Mila Palmer on 09/05/15.  t that time he was complaining ofleft hand numbness. An EKG was obtained with an automatic readout of atrial flutter. He was referred to the emergency department.on further review of his EKG it was noted that he actually was in sinus tachycardia. No atrial flutter was noted. It was felt that histachycardia may be related to stress aniety He was instructed to follow-up with cardiology as an outpatient.  Thyroid function and basic metabolic panel were within normal limits when checked by his endocrinologist on January 4.  Roberto Roberson reports significant work stress. He works 50 days continuously without a break. Each shift as 12 hours straight.  He denies any chills, dysuria or cough.  A week and a half ago he had cold symptoms, but these have resolved.  E does not drink caffeine or use over-the-counter decongestants.  Roberto Roberson reports that his heart rate has been elevated for the last 10 years. This is especially prominent when he is stressed or anxious.  He states that his heart rate was 80 bpm for he came into the building and increased after getting into the doctor's office. He denies any chest pain, shortness of breath, lower extremity edema, orthopnea or PND.  He has noted some numbness in his left hand hat is exacerbated by cold weather.  He does not get any exercise due to work stress and lack of time.   Past Medical History  Diagnosis Date  . Diabetes mellitus without complication (HCC)   .  Hypertension   . Diabetes mellitus type 2 in nonobese (HCC) 09/28/2015  . Sinus tachycardia (HCC) 09/28/2015  . Murmur 09/28/2015    No past surgical history on file.   Current Outpatient Prescriptions  Medication Sig Dispense Refill  . glucose blood (ACCU-CHEK SMARTVIEW) test strip Use as instructed to check blood sugar 2-3 times per week. Dx code E11.65 25 each 2  . losartan-hydrochlorothiazide (HYZAAR) 100-12.5 MG tablet Take 1 tablet by mouth daily.    . SitaGLIPtin-MetFORMIN HCl (JANUMET XR) 50-1000 MG TB24 Take 1 tablet by mouth 2 (two) times daily.      No current facility-administered medications for this visit.    Allergies:   Review of patient's allergies indicates no known allergies.    Social History:  The patient  reports that he has never smoked. He does not have any smokeless tobacco history on file. He reports that he does not drink alcohol.   Family History:  The patient's family history includes Diabetes in his father and mother; Hypertension in his father. There is no history of Heart disease or Kidney disease.    ROS:  Please see the history of present illness.   Otherwise, review of systems are positive for none.   All other systems are reviewed and negative.    PHYSICAL EXAM: VS:  BP 142/88 mmHg  Pulse 131  Ht 5\' 6"  (1.676 m)  Wt 70.534 kg (155 lb 8 oz)  BMI 25.11 kg/m2 ,  BMI Body mass index is 25.11 kg/(m^2). GENERAL:  Well appearing HEENT:  Pupils equal round and reactive, fundi not visualized, oral mucosa unremarkable NECK:  No jugular venous distention, waveform within normal limits, carotid upstroke brisk and symmetric, no bruits, no thyromegaly LYMPHATICS:  No cervical adenopathy LUNGS:  Clear to auscultation bilaterally HEART:  RRR.  PMI not displaced or sustained,S1 and S2 within normal limits, no S3, no S4, no clicks, no rubs, II/VI holosystolic murmur at the LLSB. ABD:  Flat, positive bowel sounds normal in frequency in pitch, no bruits, no rebound,  no guarding, no midline pulsatile mass, no hepatomegaly, no splenomegaly EXT:  2 plus pulses throughout, no edema, no cyanosis no clubbing SKIN:  No rashes no nodules NEURO:  Cranial nerves II through XII grossly intact, motor grossly intact throughout PSYCH:  Cognitively intact, oriented to person place and time    EKG:  EKG is ordered today. The ekg ordered today demonstrates sinus tachycardia. Rate 131 bpm. Nonspecific T wave abnormalities. 09/01/15: Sinus tachycardia. Rate 116 bpm.  Recent Labs: 09/06/2015: BUN 12; Creatinine, Ser 0.90; Hemoglobin 13.7; Platelets 260; Potassium 3.8; Sodium 135 09/27/2015: TSH 1.34    Lipid Panel No results found for: CHOL, TRIG, HDL, CHOLHDL, VLDL, LDLCALC, LDLDIRECT    Wt Readings from Last 3 Encounters:  09/28/15 70.534 kg (155 lb 8 oz)  09/27/15 70.217 kg (154 lb 12.8 oz)  09/06/15 70.308 kg (155 lb)      ASSESSMENT AND PLAN:  # Sinus tachycardia: Roberto Roberson has asymptomatic sinus tachycardia. He only clear trigger is stress and anxiety. He does not drink caffeinated beverages or over-the-counter decongestants. There is no sign of infection or pulmonary embolism. It is unclear whether he has inappropriate sinus tachycardia or whether his heart rate is elevated in the setting of seeing physicians. We will obtain a 24-hour Holter monitor to determine his overall heart rate and heart rate patterns.  # Murmur: Roberto Roberson has a very mild murmur. We will obtain a transthoracic echo to assess for structural heart disease.  # Hypertension: Blood pressure mildly elevated and he admits to feeling anxious.  Continue losartan/HCTZ without changes for now. His blood pressure was well-controlled on 09/27/15. Current medicines are reviewed at length with the patient today.  The patient does not have concerns regarding medicines.  The following changes have been made:  no change  Labs/ tests ordered today include:   Orders Placed This Encounter    Procedures  . Holter monitor - 24 hour  . EKG 12-Lead  . ECHOCARDIOGRAM COMPLETE     Disposition:   FU with Kalem Rockwell C. Duke Salviaandolph, MD, [facility] in 3 months.    This note was written with the assistance of speech recognition software.  Please excuse any transcriptional errors.  Signed, Destenie Ingber C. Duke Salviaandolph, MD, [facility]  09/28/2015 9:29 AM    [facility]

## 2015-09-28 NOTE — Telephone Encounter (Signed)
rx sent

## 2015-09-28 NOTE — Telephone Encounter (Signed)
accucheck is no longer going to be covered by insurance the alternates are one touch ultra or verio please call in all new rx to [facility] on [phone]

## 2015-09-28 NOTE — Patient Instructions (Signed)
Medication Instructions:  Please continue your current medications  Labwork: NONE  Testing/Procedures: 1. Echocardiogram - Your physician has requested that you have an echocardiogram. Echocardiography is a painless test that uses sound waves to create images of your heart. It provides your doctor with information about the size and shape of your heart and how well your heart's chambers and valves are working. This procedure takes approximately one hour. There are no restrictions for this procedure.  2. 24-Hour Holter - Your physician has recommended that you wear a holter monitor. Holter monitors are medical devices that record the heart's electrical activity. Doctors most often use these monitors to diagnose arrhythmias. Arrhythmias are problems with the speed or rhythm of the heartbeat. The monitor is a small, portable device. You can wear one while you do your normal daily activities. This is usually used to diagnose what is causing palpitations/syncope (passing out).  Follow-Up: Dr Duke Salviaandolph recommends that you schedule a follow-up appointment in 3 months.  If you need a refill on your cardiac medications before your next appointment, please call your pharmacy.

## 2015-09-29 ENCOUNTER — Ambulatory Visit (INDEPENDENT_AMBULATORY_CARE_PROVIDER_SITE_OTHER): Payer: Managed Care, Other (non HMO)

## 2015-09-29 DIAGNOSIS — R011 Cardiac murmur, unspecified: Secondary | ICD-10-CM

## 2015-09-29 DIAGNOSIS — R000 Tachycardia, unspecified: Secondary | ICD-10-CM | POA: Diagnosis not present

## 2015-10-10 ENCOUNTER — Other Ambulatory Visit: Payer: Self-pay | Admitting: *Deleted

## 2015-10-10 ENCOUNTER — Telehealth: Payer: Self-pay | Admitting: Endocrinology

## 2015-10-10 MED ORDER — GLUCOSE BLOOD VI STRP: ORAL_STRIP | Status: DC

## 2015-10-10 NOTE — Telephone Encounter (Signed)
Patient stated One Touch Verio test strips were to expensive, [facility]'s less expensive. She didn't know the number

## 2015-10-10 NOTE — Telephone Encounter (Signed)
[facility]

## 2015-10-11 ENCOUNTER — Other Ambulatory Visit (HOSPITAL_COMMUNITY): Payer: Managed Care, Other (non HMO)

## 2015-10-13 ENCOUNTER — Telehealth: Payer: Self-pay | Admitting: Endocrinology

## 2015-10-13 ENCOUNTER — Telehealth: Payer: Self-pay | Admitting: *Deleted

## 2015-10-13 NOTE — Telephone Encounter (Signed)
[facility] on the 17th of this month.

## 2015-10-13 NOTE — Telephone Encounter (Signed)
-----   Message from Chilton Si, MD sent at 10/12/2015  6:19 PM EST ----- No dangerous or abnormal heart rhythms noted.

## 2015-10-13 NOTE — Telephone Encounter (Signed)
Wife calling for rx for [facility] regarding the glucose meter and test strips and lancets ID# [id number]

## 2015-10-13 NOTE — Telephone Encounter (Signed)
Spoke to patien'ts wife. Result given . Verbalized understanding  

## 2015-10-17 ENCOUNTER — Other Ambulatory Visit: Payer: Self-pay | Admitting: *Deleted

## 2015-10-17 MED ORDER — GLUCOSE BLOOD VI STRP: ORAL_STRIP | Status: DC

## 2015-10-17 MED ORDER — ONETOUCH VERIO IQ SYSTEM W/DEVICE KIT: PACK | Status: AC

## 2015-10-17 MED ORDER — ONETOUCH DELICA LANCETS 33G MISC: Status: DC

## 2015-10-19 ENCOUNTER — Other Ambulatory Visit: Payer: Self-pay

## 2015-10-19 ENCOUNTER — Ambulatory Visit (HOSPITAL_COMMUNITY): Payer: Managed Care, Other (non HMO) | Attending: Cardiovascular Disease

## 2015-10-19 DIAGNOSIS — R000 Tachycardia, unspecified: Secondary | ICD-10-CM | POA: Insufficient documentation

## 2015-10-19 DIAGNOSIS — R011 Cardiac murmur, unspecified: Secondary | ICD-10-CM | POA: Diagnosis not present

## 2015-10-19 DIAGNOSIS — I10 Essential (primary) hypertension: Secondary | ICD-10-CM | POA: Diagnosis not present

## 2015-10-19 DIAGNOSIS — E119 Type 2 diabetes mellitus without complications: Secondary | ICD-10-CM | POA: Insufficient documentation

## 2015-10-24 ENCOUNTER — Telehealth: Payer: Self-pay | Admitting: *Deleted

## 2015-10-24 NOTE — Telephone Encounter (Signed)
-----   Message from Chilton Si, MD sent at 10/23/2015  4:33 PM EST ----- Normal echo.

## 2015-10-24 NOTE — Telephone Encounter (Signed)
Spoke to patient'S wife. Result given . Verbalized understanding  3 month Recall appointment schedule for 01/19/16 at 4 pm

## 2015-11-07 ENCOUNTER — Other Ambulatory Visit: Payer: Managed Care, Other (non HMO)

## 2015-11-08 ENCOUNTER — Ambulatory Visit: Payer: Managed Care, Other (non HMO) | Admitting: Endocrinology

## 2015-11-10 ENCOUNTER — Ambulatory Visit: Payer: Managed Care, Other (non HMO) | Admitting: Endocrinology

## 2015-11-27 ENCOUNTER — Other Ambulatory Visit (INDEPENDENT_AMBULATORY_CARE_PROVIDER_SITE_OTHER): Payer: Managed Care, Other (non HMO)

## 2015-11-27 DIAGNOSIS — E1165 Type 2 diabetes mellitus with hyperglycemia: Secondary | ICD-10-CM

## 2015-11-27 LAB — BASIC METABOLIC PANEL
BUN: 19 mg/dL (ref 6–23)
CALCIUM: 9.4 mg/dL (ref 8.4–10.5)
CHLORIDE: 103 meq/L (ref 96–112)
CO2: 26 meq/L (ref 19–32)
CREATININE: 0.85 mg/dL (ref 0.40–1.50)
GFR: 104.21 mL/min (ref >60.00)
Glucose, Bld: 144 mg/dL — ABNORMAL HIGH (ref 70–99)
Potassium: 4.5 mEq/L (ref 3.5–5.1)
Sodium: 136 mEq/L (ref 135–145)

## 2015-11-27 LAB — LIPID PANEL
Cholesterol: 170 mg/dL (ref 0–200)
HDL: 38.5 mg/dL — AB (ref >39.00)
LDL Cholesterol: 110 mg/dL — ABNORMAL HIGH (ref 0–99)
NONHDL: 131.57
TRIGLYCERIDES: 106 mg/dL (ref 0.0–149.0)
Total CHOL/HDL Ratio: 4
VLDL: 21.2 mg/dL (ref 0.0–40.0)

## 2015-11-28 LAB — FRUCTOSAMINE: FRUCTOSAMINE: 255 umol/L (ref 0–285)

## 2015-11-30 ENCOUNTER — Ambulatory Visit: Payer: Managed Care, Other (non HMO) | Admitting: Endocrinology

## 2015-12-22 ENCOUNTER — Ambulatory Visit (INDEPENDENT_AMBULATORY_CARE_PROVIDER_SITE_OTHER): Payer: Managed Care, Other (non HMO) | Admitting: Endocrinology

## 2015-12-22 ENCOUNTER — Encounter: Payer: Self-pay | Admitting: Endocrinology

## 2015-12-22 VITALS — BP 120/80 | HR 100 | Temp 98.2°F | Resp 14 | Ht 66.0 in | Wt 151.4 lb

## 2015-12-22 DIAGNOSIS — E1165 Type 2 diabetes mellitus with hyperglycemia: Secondary | ICD-10-CM

## 2015-12-22 MED ORDER — ACARBOSE 25 MG PO TABS: 25.0000 mg | ORAL_TABLET | Freq: Three times a day (TID) | ORAL | Status: AC

## 2015-12-22 NOTE — Patient Instructions (Signed)
Acarbose 25mg  just before am meal  If sugar >180 after lunch and dinner may add at those meals  Janumet 2 tabs 1x daily  Check blood sugars on waking up  2-3 times a week Also check blood sugars about 2 hours after a meal and do this after different meals by rotation  Recommended blood sugar levels on waking up is 90-130 and about 2 hours after meal is 130-160  Please bring your blood sugar monitor to each visit, thank you

## 2015-12-22 NOTE — Progress Notes (Signed)
Patient ID: Roberto Roberson, male   DOB: Jun 13, 1971, 45 y.o.   MRN: [id number]           Reason for Appointment: Follow-up for Type 2 Diabetes   History of Present Illness:          Date of diagnosis of type 2 diabetes mellitus:  2006      Background history:   At diagnosis he had a glucose of 300 and do symptomatic with increased thirst and urination He was given unknown oral hypoglycemic drugs in [place] before he came here  Recent history:   He has been treated with Janumet XR since late 2015, previously on glipizide and metformin His A1c was 6.4 in 7/16 but was 7% on his initial visit in 1/17  Because of his high carbohydrate diet without protein he was advised to change her diet significantly on his last visit Also advised to check blood sugars consistently especially after meals Advised him to start regular walking which she was not doing  Current blood sugar patterns and problems identified:  He is not coming back 2 months after his initial visit  He has done only a few blood sugars and they all appear to be high  Fasting glucose was also high in the lab  Fructosamine was not significantly high at 255 however  He has however started walking a little and has lost 4 pounds  Non-insulin hypoglycemic drugs the patient is taking are: Janumet 50/1000 twice a day      Side effects from medications have been:  Compliance with the medical regimen: Fair   Glucose monitoring:  done 0.1 times a day         Glucometer: One Touch      Blood Glucose readings by time of day and averages from meter download, has only 7 readings for the last 4 weeks: RANGE 154-195 at higher readings in the afternoon  Self-care: The diet that the patient has been following is: tries to limit sugar and fats .     Meal times are:  Breakfast is at Lunch: Dinner:   Typical meal intake: Breakfast is rice or wheat based bread, some lentils, similar meals at lunch and other with some vegetables.  Has  fruit for snacks                Dietician visit, most recent: Never               Exercise:  he thinks he is walking 10,000 steps a day  Weight: has gained about 3-4 pounds since 7/16   Wt Readings from Last 3 Encounters:  12/22/15 151 lb 6.4 oz (68.675 kg)  09/28/15 155 lb 8 oz (70.534 kg)  09/27/15 154 lb 12.8 oz (70.217 kg)    Glycemic control:    A1c outside lab = 7.0  Microalbumin/creatinine ratio 1.1 in 7/16 Last LDL was 100   No results found for: HGBA1C Lab Results  Component Value Date   LDLCALC 110* 11/27/2015   CREATININE 0.85 11/27/2015   No visits with results within 1 Week(s) from this visit. Latest known visit with results is:  Lab on 11/27/2015  Component Date Value Ref Range Status  . Fructosamine 11/27/2015 255  0 - 285 umol/L Final   Comment: Published reference interval for apparently healthy subjects between age 18 and 65 is 51 - 285 umol/L and in a poorly controlled diabetic population is 228 - 563 umol/L with a mean of 396 umol/L.   Marland Kitchen  Cholesterol 11/27/2015 170  0 - 200 mg/dL Final   ATP III Classification       Desirable:  < 200 mg/dL               Borderline High:  200 - 239 mg/dL          High:  > = 240 mg/dL  . Triglycerides 11/27/2015 106.0  0.0 - 149.0 mg/dL Final   Normal:  <150 mg/dLBorderline High:  150 - 199 mg/dL  . HDL 11/27/2015 38.50* >39.00 mg/dL Final  . VLDL 11/27/2015 21.2  0.0 - 40.0 mg/dL Final  . LDL Cholesterol 11/27/2015 110* 0 - 99 mg/dL Final  . Total CHOL/HDL Ratio 11/27/2015 4   Final                  Men          Women1/2 Average Risk     3.4          3.3Average Risk          5.0          4.42X Average Risk          9.6          7.13X Average Risk          15.0          11.0                      . NonHDL 11/27/2015 131.57   Final   NOTE:  Non-HDL goal should be 30 mg/dL higher than patient's LDL goal (i.e. LDL goal of < 70 mg/dL, would have non-HDL goal of < 100 mg/dL)  . Sodium 11/27/2015 136  135 - 145 mEq/L Final    . Potassium 11/27/2015 4.5  3.5 - 5.1 mEq/L Final  . Chloride 11/27/2015 103  96 - 112 mEq/L Final  . CO2 11/27/2015 26  19 - 32 mEq/L Final  . Glucose, Bld 11/27/2015 144* 70 - 99 mg/dL Final  . BUN 11/27/2015 19  6 - 23 mg/dL Final  . Creatinine, Ser 11/27/2015 0.85  0.40 - 1.50 mg/dL Final  . Calcium 11/27/2015 9.4  8.4 - 10.5 mg/dL Final  . GFR 11/27/2015 104.21  >60.00 mL/min Final        Medication List       This list is accurate as of: 12/22/15 11:59 PM.  Always use your most recent med list.               acarbose 25 MG tablet  Commonly known as:  PRECOSE  Take 1 tablet (25 mg total) by mouth 3 (three) times daily with meals.     glucose blood test strip  Commonly known as:  ONETOUCH VERIO  Use as instructed to check blood sugar 2-3 times per week dx code E11.65     JANUMET XR 50-1000 MG Tb24  Generic drug:  SitaGLIPtin-MetFORMIN HCl  Take 1 tablet by mouth 2 (two) times daily.     losartan-hydrochlorothiazide 100-12.5 MG tablet  Commonly known as:  HYZAAR  Take 1 tablet by mouth daily.     ONETOUCH DELICA LANCETS 40J Misc  Use to check blood sugar 2-3 times per week Dx code E11.65     ONETOUCH VERIO IQ SYSTEM w/Device Kit  Use to check blood sugar 2-3 times per week Dx code E11.65        Allergies: No Known Allergies  Past Medical History  Diagnosis Date  . Diabetes mellitus without complication ([place])   . Hypertension   . Diabetes mellitus type 2 in nonobese ([place]) 09/28/2015  . Sinus tachycardia ([place]) 09/28/2015  . Murmur 09/28/2015    No past surgical history on file.  Family History  Problem Relation Age of Onset  . Diabetes Mother   . Diabetes Father   . Hypertension Father   . Heart disease Neg Hx   . Kidney disease Neg Hx     Social History:  reports that he has never smoked. He does not have any smokeless tobacco history on file. He reports that he does not drink alcohol. His drug history is not on file.    Review of Systems     Lipid history:  Not on any medications    Lab Results  Component Value Date   CHOL 170 11/27/2015   HDL 38.50* 11/27/2015   LDLCALC 110* 11/27/2015   TRIG 106.0 11/27/2015   CHOLHDL 4 11/27/2015           Hypertension: On treatment for about a year with losartan HCT  Most recent eye exam was in 12/16  Most recent foot exam: 1/17  Review of Systems     Physical Examination:  BP 120/80 mmHg  Pulse 100  Temp(Src) 98.2 F (36.8 C)  Resp 14  Ht 5' 6"  (1.676 m)  Wt 151 lb 6.4 oz (68.675 kg)  BMI 24.45 kg/m2  SpO2 97%  ASSESSMENT:  Diabetes type 2, nonobese with fair control and last A1c 7% Still has high postprandial readings with current regimen of Janumet XR maximum doses However his fructosamine is not significantly high He does need to modify his diet significantly and he has not made any changes suggested on the previous visit with cutting back on carbohydrates like rice and adding protein to each meal especially breakfast and lunch No readings after supper  PLAN:    Discussed improved meal planning with low glycemic index foods and adding protein to each meal, discussed sources of protein for vegetarian meal plan  Start Precose 25 mg at the start of breakfast daily.  If blood sugars are over 180 after lunch with modifying diet he may need to add Precose at lunch also at least  Check more readings after meals especially supper  May take Janumet together once a day for convenience  Will need to discuss lipid management on the next visit, may need repeat levels after improving diet  Patient Instructions  Acarbose 71m just before am meal  If sugar >180 after lunch and dinner may add at those meals  Janumet 2 tabs 1x daily  Check blood sugars on waking up  2-3 times a week Also check blood sugars about 2 hours after a meal and do this after different meals by rotation  Recommended blood sugar levels on waking up is 90-130 and about 2 hours after meal  is 130-160  Please bring your blood sugar monitor to each visit, thank you     [facility]4/10/2015, 9:21 PM   Note: This office note was prepared with Dragon voice recognition system technology. Any transcriptional errors that result from this process are unintentional.

## 2016-01-19 ENCOUNTER — Ambulatory Visit: Payer: Managed Care, Other (non HMO) | Admitting: Cardiovascular Disease

## 2016-05-16 ENCOUNTER — Other Ambulatory Visit: Payer: Managed Care, Other (non HMO)

## 2016-05-21 ENCOUNTER — Ambulatory Visit: Payer: Managed Care, Other (non HMO) | Admitting: Endocrinology

## 2016-06-11 ENCOUNTER — Other Ambulatory Visit (INDEPENDENT_AMBULATORY_CARE_PROVIDER_SITE_OTHER): Payer: Managed Care, Other (non HMO)

## 2016-06-11 DIAGNOSIS — E1165 Type 2 diabetes mellitus with hyperglycemia: Secondary | ICD-10-CM

## 2016-06-11 LAB — LIPID PANEL
Cholesterol: 178 mg/dL (ref 0–200)
HDL: 40.6 mg/dL (ref >39.00)
LDL Cholesterol: 105 mg/dL — ABNORMAL HIGH (ref 0–99)
NONHDL: 137.73
Total CHOL/HDL Ratio: 4
Triglycerides: 163 mg/dL — ABNORMAL HIGH (ref 0.0–149.0)
VLDL: 32.6 mg/dL (ref 0.0–40.0)

## 2016-06-11 LAB — HEMOGLOBIN A1C: Hgb A1c MFr Bld: 6.9 % — ABNORMAL HIGH (ref 4.6–6.5)

## 2016-06-11 LAB — BASIC METABOLIC PANEL
BUN: 10 mg/dL (ref 6–23)
CALCIUM: 9.3 mg/dL (ref 8.4–10.5)
CO2: 28 mEq/L (ref 19–32)
Chloride: 104 mEq/L (ref 96–112)
Creatinine, Ser: 0.8 mg/dL (ref 0.40–1.50)
GFR: 111.49 mL/min (ref >60.00)
GLUCOSE: 139 mg/dL — AB (ref 70–99)
Potassium: 4.1 mEq/L (ref 3.5–5.1)
SODIUM: 137 meq/L (ref 135–145)

## 2016-06-14 ENCOUNTER — Ambulatory Visit: Payer: Managed Care, Other (non HMO) | Admitting: Endocrinology

## 2016-06-17 ENCOUNTER — Other Ambulatory Visit: Payer: Self-pay | Admitting: *Deleted

## 2016-06-17 ENCOUNTER — Ambulatory Visit (INDEPENDENT_AMBULATORY_CARE_PROVIDER_SITE_OTHER): Payer: Managed Care, Other (non HMO) | Admitting: Endocrinology

## 2016-06-17 ENCOUNTER — Encounter: Payer: Self-pay | Admitting: Endocrinology

## 2016-06-17 VITALS — BP 124/78 | HR 111 | Temp 97.9°F | Resp 14 | Ht 66.0 in | Wt 155.8 lb

## 2016-06-17 DIAGNOSIS — I10 Essential (primary) hypertension: Secondary | ICD-10-CM

## 2016-06-17 DIAGNOSIS — E782 Mixed hyperlipidemia: Secondary | ICD-10-CM | POA: Diagnosis not present

## 2016-06-17 DIAGNOSIS — E1165 Type 2 diabetes mellitus with hyperglycemia: Secondary | ICD-10-CM

## 2016-06-17 MED ORDER — GLUCOSE BLOOD VI STRP: ORAL_STRIP | 2 refills | Status: AC

## 2016-06-17 MED ORDER — ONETOUCH DELICA LANCETS 33G MISC: 2 refills | Status: AC

## 2016-06-17 NOTE — Patient Instructions (Signed)
Acarbose 1/2 at Bfst and 1/2 at dinner  Avoid coconut products  Check blood sugars on waking up  2x per week  Also check blood sugars about 2 hours after a meal and do this after different meals by rotation  Recommended blood sugar levels on waking up is 90-130 and about 2 hours after meal is 130-160  Please bring your blood sugar monitor to each visit, thank you

## 2016-06-17 NOTE — Progress Notes (Signed)
Patient ID: Roberto Roberson, male   DOB: [birth year], 45 y.o.   MRN: [id number]           Reason for Appointment: Follow-up for Type 2 Diabetes   History of Present Illness:          Date of diagnosis of type 2 diabetes mellitus:  2006      Background history:   At diagnosis he had a glucose of 300 and do symptomatic with increased thirst and urination He was given unknown oral hypoglycemic drugs in [place] before he came here He has been treated with Janumet XR since late 2015, previously on glipizide and metformin   Recent history:   His A1c was 6.4 in 7/16 but was 7% on his initial visit in 1/17 and now 6.9  He has not been seen in follow-up for several months  Current blood sugar patterns and problems identified:  He is not checking his blood sugars in the last couple of weeks because he lost his meter  He was started on PRECOSE on his last visit at least at breakfast and he was told to consider doing this at lunch also  He has taken this only at breakfast as he thinks his afternoon readings are fairly good  However he is complaining of some bloating and gaseousness after taking the medication in the morning  He thinks he was not following his diet for a couple months when he was in [place] and has gained a little weight  However he thinks his fasting readings are fairly good  Probably not watching his diet consistently when he was in [place] with eating more coconut based and cheese products  Non-insulin hypoglycemic drugs the patient is taking are: Janumet 50/1000 twice a day      Side effects from medications have been:  Compliance with the medical regimen: Fair   Glucose monitoring:  done 0.1 times a day         Glucometer: One Touch      Blood Glucose readings by time of day by recall  Mean values apply above for all meters except median for One Touch  PRE-MEAL Fasting Lunch Dinner Bedtime Overall  Glucose range: 88-92 130+ 130-140  150   Mean/median:           Self-care: The diet that the patient has been following is: tries to limit sugar and fats .         Typical meal intake: Breakfast is rice or wheat based bread, some lentils, similar meals at lunch and other with some vegetables.  Has fruit for snacks                Dietician visit, most recent: Never               Exercise:  he thinks he is walking in the evenings  Weight:   Wt Readings from Last 3 Encounters:  06/17/16 155 lb 12.8 oz (70.7 kg)  12/22/15 151 lb 6.4 oz (68.7 kg)  09/28/15 155 lb 8 oz (70.5 kg)    Glycemic control:   Microalbumin/creatinine ratio 1.1 in 7/16 From PCP previously LDL was 100    Lab Results  Component Value Date   HGBA1C 6.9 (H) 06/11/2016   Lab Results  Component Value Date   LDLCALC 105 (H) 06/11/2016   CREATININE 0.80 06/11/2016   Lab on 06/11/2016  Component Date Value Ref Range Status  . Hgb A1c MFr Bld 06/11/2016 6.9* 4.6 - 6.5 % Final  .  Sodium 06/11/2016 137  135 - 145 mEq/L Final  . Potassium 06/11/2016 4.1  3.5 - 5.1 mEq/L Final  . Chloride 06/11/2016 104  96 - 112 mEq/L Final  . CO2 06/11/2016 28  19 - 32 mEq/L Final  . Glucose, Bld 06/11/2016 139* 70 - 99 mg/dL Final  . BUN 06/11/2016 10  6 - 23 mg/dL Final  . Creatinine, Ser 06/11/2016 0.80  0.40 - 1.50 mg/dL Final  . Calcium 06/11/2016 9.3  8.4 - 10.5 mg/dL Final  . GFR 06/11/2016 111.49  >60.00 mL/min Final  . Cholesterol 06/11/2016 178  0 - 200 mg/dL Final  . Triglycerides 06/11/2016 163.0* 0.0 - 149.0 mg/dL Final  . HDL 06/11/2016 40.60  >39.00 mg/dL Final  . VLDL 06/11/2016 32.6  0.0 - 40.0 mg/dL Final  . LDL Cholesterol 06/11/2016 105* 0 - 99 mg/dL Final  . Total CHOL/HDL Ratio 06/11/2016 4   Final  . NonHDL 06/11/2016 137.73   Final        Medication List       Accurate as of 06/17/16 12:40 PM. Always use your most recent med list.          acarbose 25 MG tablet Commonly known as:  PRECOSE Take 1 tablet (25 mg total) by mouth 3 (three) times daily  with meals.   glucose blood test strip Commonly known as:  ONETOUCH VERIO Use as instructed to check blood sugar 1 time per day dx code E11.65   JANUMET XR 50-1000 MG Tb24 Generic drug:  SitaGLIPtin-MetFORMIN HCl Take 1 tablet by mouth 2 (two) times daily.   losartan-hydrochlorothiazide 100-12.5 MG tablet Commonly known as:  HYZAAR Take 1 tablet by mouth daily.   ONETOUCH DELICA LANCETS 54U Misc Use to check blood sugar 1 times per day. Dx code E11.65   [facility] VERIO IQ SYSTEM w/Device Kit Use to check blood sugar 2-3 times per week Dx code E11.65       Allergies: No Known Allergies  Past Medical History:  Diagnosis Date  . Diabetes mellitus type 2 in nonobese ([place]) 09/28/2015  . Diabetes mellitus without complication ([place])   . Hypertension   . Murmur 09/28/2015  . Sinus tachycardia ([place]) 09/28/2015    No past surgical history on file.  Family History  Problem Relation Age of Onset  . Diabetes Mother   . Diabetes Father   . Hypertension Father   . Heart disease Neg Hx   . Kidney disease Neg Hx     Social History:  reports that he has never smoked. He does not have any smokeless tobacco history on file. He reports that he does not drink alcohol. His drug history is not on file.    Review of Systems    Lipid history: Not on any medications, Prior LDL was 100 More recently has not been consistent with diet, probably more saturated fat with coconut based cooking    Lab Results  Component Value Date   CHOL 178 06/11/2016   HDL 40.60 06/11/2016   LDLCALC 105 (H) 06/11/2016   TRIG 163.0 (H) 06/11/2016   CHOLHDL 4 06/11/2016           Hypertension: On treatment for about a year with losartan HCT, Followed by PCP  Most recent eye exam was in 12/16  Most recent foot exam: 1/17  Review of Systems     Physical Examination:  BP 124/78   Pulse (!) 111   Temp 97.9 F (36.6 C)   Resp 14  Ht 5' 6" (1.676 m)   Wt 155 lb 12.8 oz (70.7 kg)   SpO2 97%   BMI  25.15 kg/m   ASSESSMENT:  Diabetes type 2, nonobese with fair control and last A1c 6.9  He has been started on ACARBOSE for postprandial hyperglycemia but appears to be not tolerating this well with the 25 mg dose in the morning Although he is trying to walk and exercise his weight has gone up especially with [place] a [place] and not inconsistent with diet He may still have some high readings after supper but since he did not bring his monitor not clear how often he is checking blood sugars and how often he is doing postprandial reading  Considering his young age he needs to have A1c target of 6.5  LIPIDS: He is at high risk because of diabetes and ethnic status, he is somewhat reluctant to consider medication Currently diet has not been optimal with more saturated fat  PLAN:    He will try to take Precose half tablet before breakfast and suppertime and call if not able to tolerate this  Consistent diet with low saturated fat  Continued treatment with Janumet XR, reassured him that this is safe long-term despite his being told by pharmacist otherwise  Consistent monitoring after meals, One Touch Verio monitor given Discussed blood sugar targets  Needs short-term follow-up  We'll also need follow-up lipids  Reminded him to get annual eye exams  Patient Instructions  Acarbose 1/2 at Bfst and 1/2 at dinner  Avoid coconut products  Check blood sugars on waking up  2x per week  Also check blood sugars about 2 hours after a meal and do this after different meals by rotation  Recommended blood sugar levels on waking up is 90-130 and about 2 hours after meal is 130-160  Please bring your blood sugar monitor to each visit, thank you    [facility] 06/17/2016, 12:40 PM   Note: This office note was prepared with Dragon voice recognition system technology. Any transcriptional errors that result from this process are unintentional.

## 2016-07-17 ENCOUNTER — Telehealth: Payer: Self-pay | Admitting: Endocrinology

## 2016-07-17 NOTE — Telephone Encounter (Signed)
Patient need a refill of losartan-hydrochlorothiazide (HYZAAR) 100-12.5 MG tablet, SitaGLIPtin-MetFORMIN HCl (JANUMET XR) 50-1000 MG TB24  [facility] [place] [facility] [place]  [facility] [address] [phone] (Phone) [phone] (Fax)

## 2016-07-18 ENCOUNTER — Other Ambulatory Visit: Payer: Self-pay

## 2016-07-18 MED ORDER — LOSARTAN POTASSIUM-HCTZ 100-12.5 MG PO TABS: 1.0000 | ORAL_TABLET | Freq: Every day | ORAL | 3 refills | Status: DC

## 2016-07-18 NOTE — Telephone Encounter (Signed)
Sent in with 2 refills

## 2016-07-23 MED ORDER — SITAGLIP PHOS-METFORMIN HCL ER 50-1000 MG PO TB24: 1.0000 | ORAL_TABLET | Freq: Two times a day (BID) | ORAL | 4 refills | Status: AC

## 2016-07-23 NOTE — Telephone Encounter (Signed)
Refill submitted per patient's request.  

## 2016-07-23 NOTE — Telephone Encounter (Signed)
Pt is in need of refills for [facility]umet [facility] [place] did not [facility]

## 2016-09-25 ENCOUNTER — Other Ambulatory Visit: Payer: Managed Care, Other (non HMO)

## 2016-09-30 ENCOUNTER — Ambulatory Visit: Payer: Managed Care, Other (non HMO) | Admitting: Endocrinology

## 2016-11-21 ENCOUNTER — Telehealth: Payer: Self-pay | Admitting: Endocrinology

## 2016-11-21 ENCOUNTER — Other Ambulatory Visit: Payer: Self-pay

## 2016-11-21 MED ORDER — LOSARTAN POTASSIUM-HCTZ 100-12.5 MG PO TABS: 1.0000 | ORAL_TABLET | Freq: Every day | ORAL | 3 refills | Status: DC

## 2016-11-21 NOTE — Telephone Encounter (Signed)
Refill of \  losartan-hydrochlorothiazide (HYZAAR) 100-12.5 MG tablet 30 tablet   [facility] [place] [facility] [place] [facility] [place] [place] - [address] [phone] (Phone) [phone] (Fax)

## 2016-11-21 NOTE — Telephone Encounter (Signed)
Ordered

## 2016-12-26 ENCOUNTER — Other Ambulatory Visit: Payer: Self-pay

## 2016-12-26 MED ORDER — LOSARTAN POTASSIUM-HCTZ 100-12.5 MG PO TABS: 1.0000 | ORAL_TABLET | Freq: Every day | ORAL | 3 refills | Status: AC

## 2017-04-21 IMAGING — MR MR HEAD W/O CM
9 of 10 series · 35 of 48 positions shown · non-contrast
Comparison: None.

CLINICAL DATA: Five day history of numbness in the left hand. New
onset of weakness in the left shoulder and left upper extremity in
general. Intermittent numbness in the right hand.

EXAM:
MRI HEAD WITHOUT CONTRAST
TECHNIQUE: Multiplanar, multiecho pulse sequences of the brain and surrounding
structures were obtained without intravenous contrast.

[Series 3: T1 · sagittal · 5.0mm · 0.47mm/px · 2 of 23 slices shown]
[im 1/23]
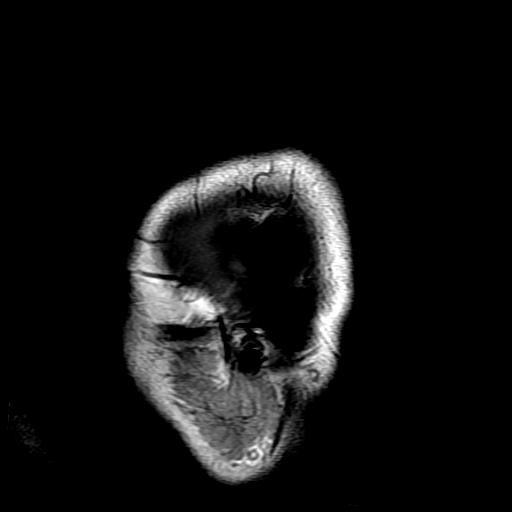
[im 23/23]
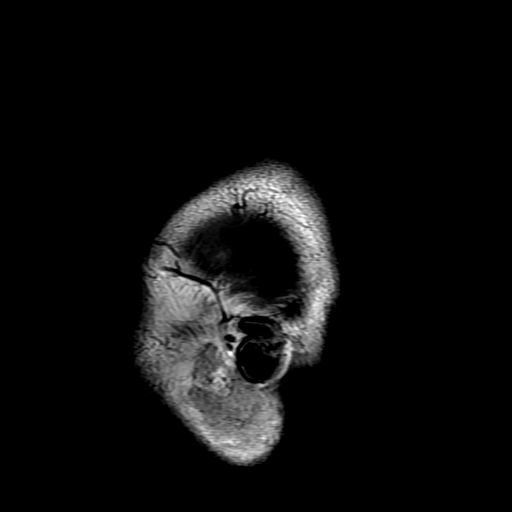

[Series 4: DWI · axial · 3.0mm · 1.09mm/px · z∈[-29,+99]mm · 8 of 88 slices shown (1 of 4)]
[im 1/88]
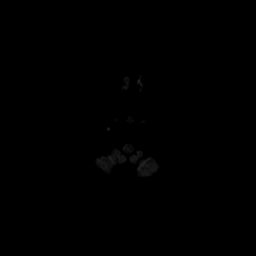
[im 10/88]
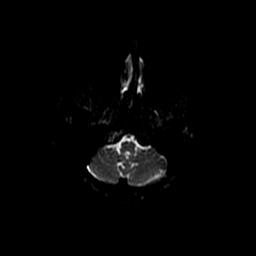
[im 30/88]
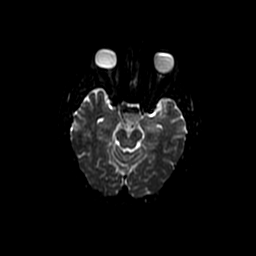
[im 39/88]
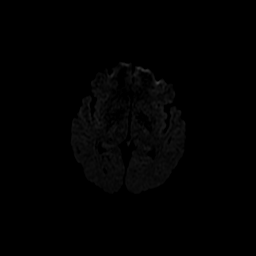
[im 49/88]
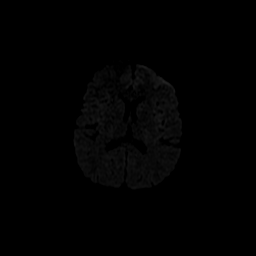
[im 59/88]
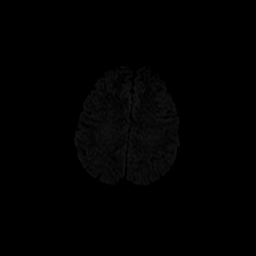
[im 78/88]
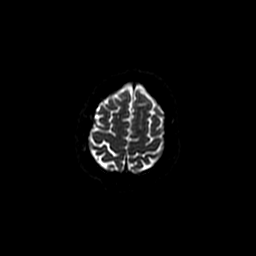
[im 88/88]
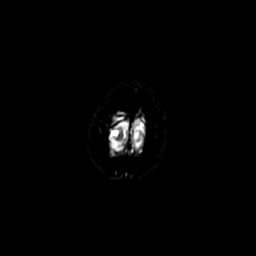

[Series 5: T2 · axial · 5.0mm · 0.43mm/px · z∈[-41,+95]mm · 3 of 24 slices shown (1 of 2)]
[im 1/24]
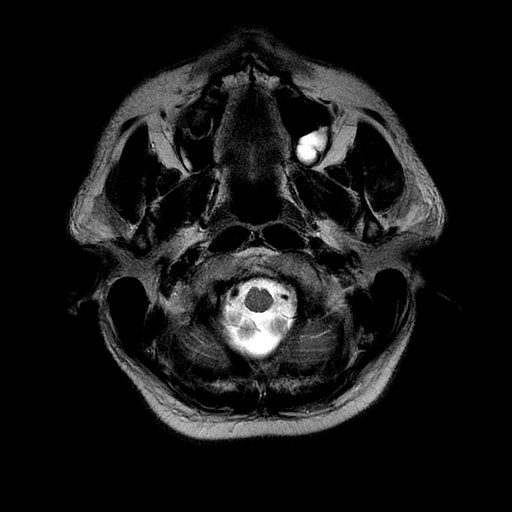
[im 12/24]
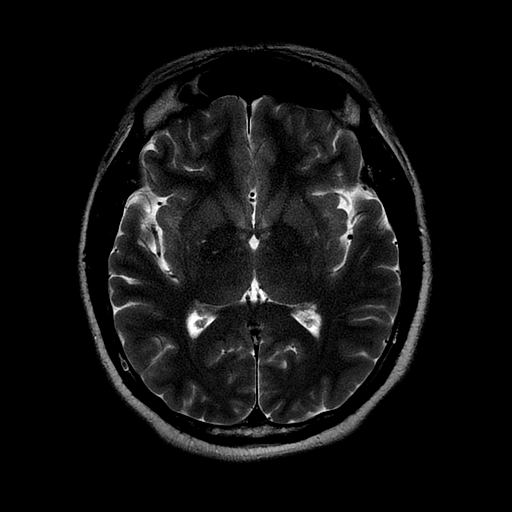
[im 24/24]
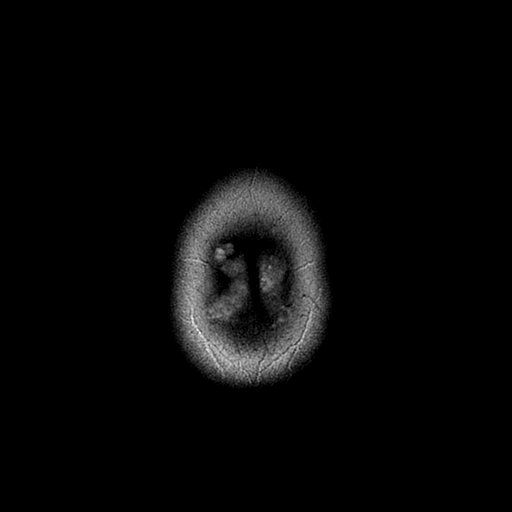

[Series 6: FLAIR · axial · 5.0mm · 0.43mm/px · z∈[-41,+95]mm · 3 of 24 slices shown]
[im 1/24]
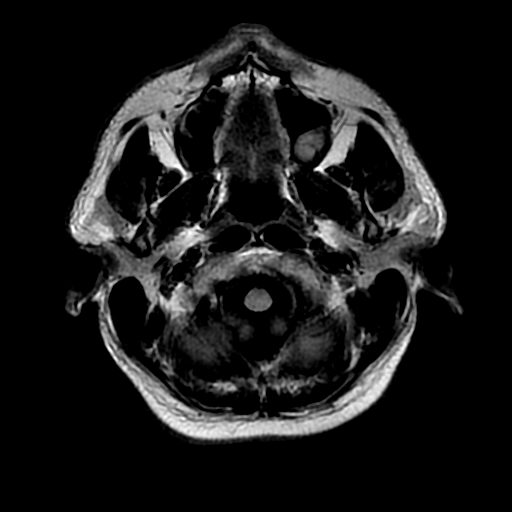
[im 12/24]
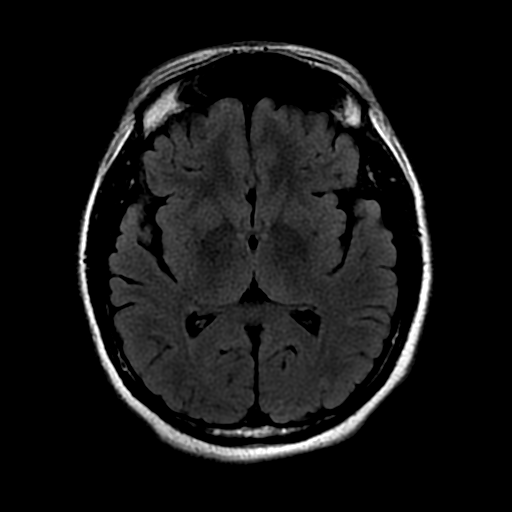
[im 24/24]
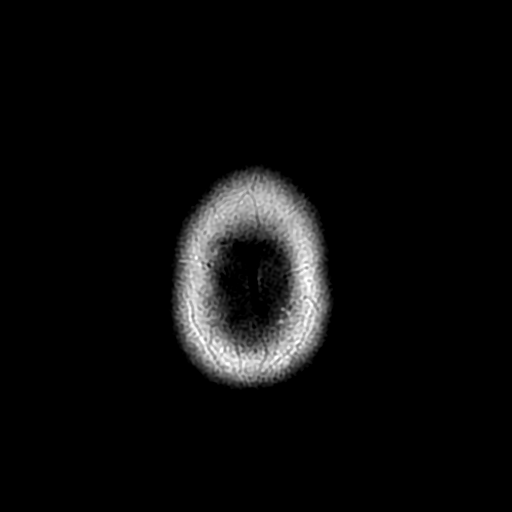

[Series 7: DWI · coronal · 5.0mm · 1.09mm/px · 6 of 58 slices shown (2 of 4)]
[im 1/58]
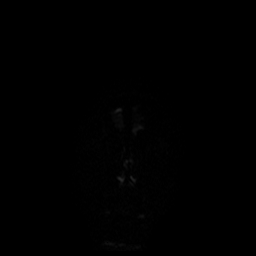
[im 12/58]
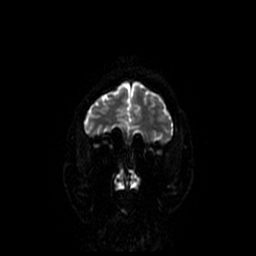
[im 23/58]
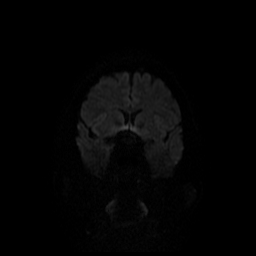
[im 35/58]
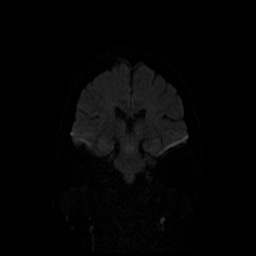
[im 46/58]
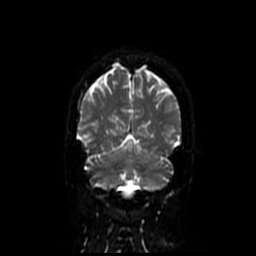
[im 58/58]
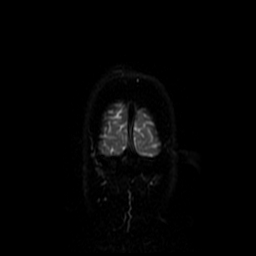

[Series 8: ax mpgr · axial · 5.0mm · 0.43mm/px · z∈[-46,+92]mm · 2 of 21 slices shown]
[im 1/21]
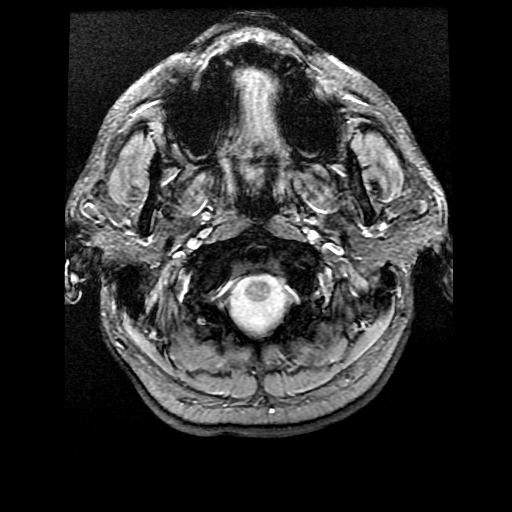
[im 21/21]
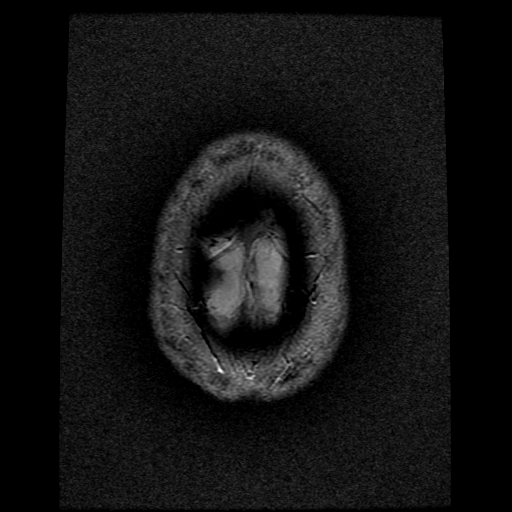

[Series 10: T2 · coronal · 5.0mm · 0.39mm/px · 3 of 24 slices shown (2 of 2)]
[im 1/24]
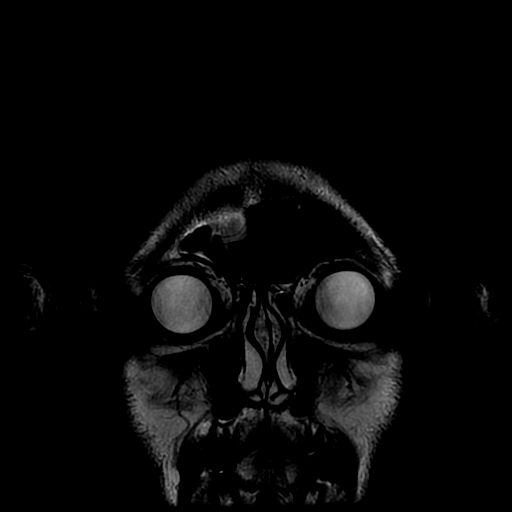
[im 12/24]
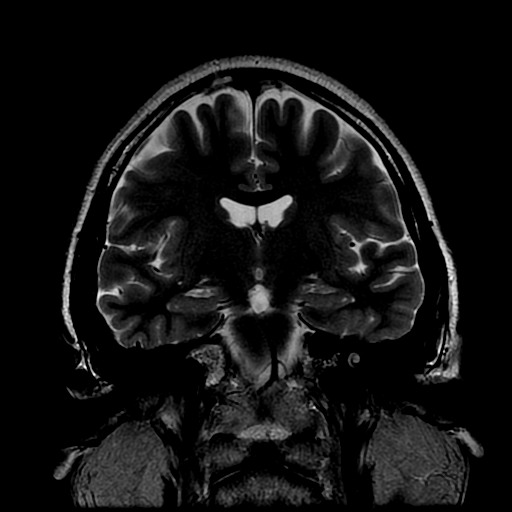
[im 24/24]
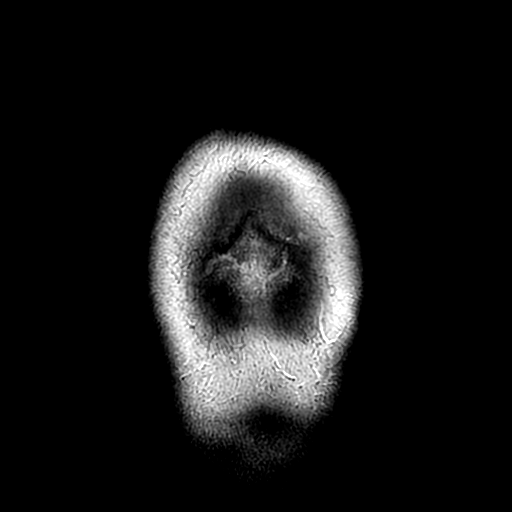

[Series 400: DWI · axial · 3.0mm · 1.09mm/px · z∈[-29,+99]mm · 5 of 44 slices shown (3 of 4)]
[im 1/44]
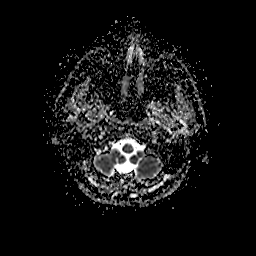
[im 11/44]
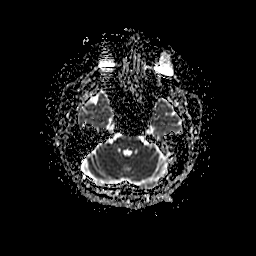
[im 22/44]
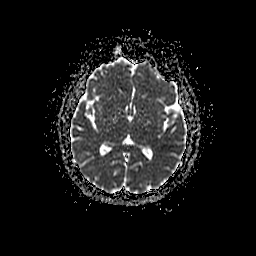
[im 33/44]
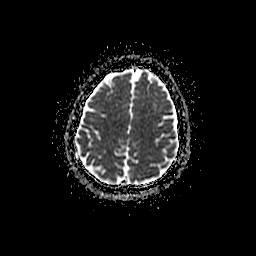
[im 44/44]
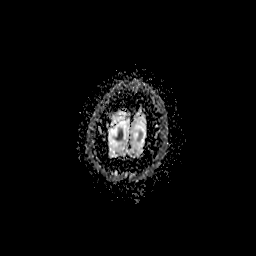

[Series 700: DWI · coronal · 5.0mm · 1.09mm/px · 3 of 29 slices shown (4 of 4)]
[im 1/29]
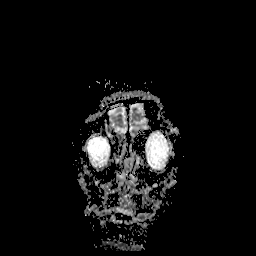
[im 15/29]
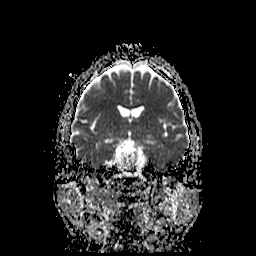
[im 29/29]
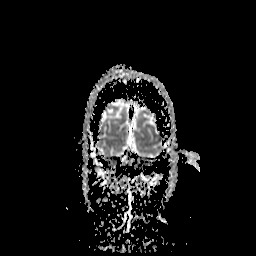

[35 of 48 positions shown; findings below may reference images not displayed]

FINDINGS: The brain has a normal appearance on all pulse sequences without
evidence of malformation, atrophy, old or acute infarction, mass
lesion, hemorrhage, hydrocephalus or extra-axial collection. No
pituitary mass. No fluid in the sinuses, middle ears or mastoids. No
skull or skullbase lesion. There is flow in the major vessels at the
base of the brain. Major venous sinuses show flow.
IMPRESSION: Normal examination. No cause of the presenting symptoms is
identified.
